# Patient Record
Sex: Female | Born: 1945 | Race: White | Hispanic: No | Smoking: Never smoker
Health system: Southern US, Community
[De-identification: ages and names within clinical notes are randomized; demographics above are authoritative.]

## PROBLEM LIST (undated history)

## (undated) DIAGNOSIS — R569 Unspecified convulsions: Secondary | ICD-10-CM

## (undated) DIAGNOSIS — F329 Major depressive disorder, single episode, unspecified: Secondary | ICD-10-CM

## (undated) DIAGNOSIS — F32A Depression, unspecified: Secondary | ICD-10-CM

## (undated) DIAGNOSIS — R6250 Unspecified lack of expected normal physiological development in childhood: Secondary | ICD-10-CM

## (undated) DIAGNOSIS — E079 Disorder of thyroid, unspecified: Secondary | ICD-10-CM

## (undated) HISTORY — DX: Disorder of thyroid, unspecified: E07.9

## (undated) HISTORY — DX: Depression, unspecified: F32.A

## (undated) HISTORY — PX: NECK SURGERY: SHX720

## (undated) HISTORY — PX: ABDOMINAL HYSTERECTOMY: SHX81

## (undated) HISTORY — DX: Major depressive disorder, single episode, unspecified: F32.9

---

## 1999-12-05 ENCOUNTER — Encounter: Payer: Self-pay | Admitting: Family Medicine

## 1999-12-05 ENCOUNTER — Encounter: Admission: RE | Admit: 1999-12-05 | Discharge: 1999-12-05 | Payer: Self-pay | Admitting: Family Medicine

## 2003-08-09 ENCOUNTER — Ambulatory Visit (HOSPITAL_COMMUNITY): Admission: RE | Admit: 2003-08-09 | Discharge: 2003-08-09 | Payer: Self-pay | Admitting: Family Medicine

## 2003-08-09 ENCOUNTER — Encounter: Payer: Self-pay | Admitting: Family Medicine

## 2003-09-14 ENCOUNTER — Other Ambulatory Visit: Admission: RE | Admit: 2003-09-14 | Discharge: 2003-09-14 | Payer: Self-pay | Admitting: Family Medicine

## 2003-11-23 ENCOUNTER — Encounter: Admission: RE | Admit: 2003-11-23 | Discharge: 2003-11-23 | Payer: Self-pay | Admitting: Family Medicine

## 2004-01-28 ENCOUNTER — Encounter: Admission: RE | Admit: 2004-01-28 | Discharge: 2004-01-28 | Payer: Self-pay | Admitting: Family Medicine

## 2004-03-20 ENCOUNTER — Emergency Department (HOSPITAL_COMMUNITY): Admission: EM | Admit: 2004-03-20 | Discharge: 2004-03-20 | Payer: Self-pay | Admitting: Emergency Medicine

## 2004-03-20 ENCOUNTER — Encounter (INDEPENDENT_AMBULATORY_CARE_PROVIDER_SITE_OTHER): Payer: Self-pay | Admitting: *Deleted

## 2004-03-20 ENCOUNTER — Ambulatory Visit (HOSPITAL_COMMUNITY): Admission: RE | Admit: 2004-03-20 | Discharge: 2004-03-20 | Payer: Self-pay | Admitting: Endocrinology

## 2004-05-05 ENCOUNTER — Encounter (INDEPENDENT_AMBULATORY_CARE_PROVIDER_SITE_OTHER): Payer: Self-pay | Admitting: Specialist

## 2004-05-05 ENCOUNTER — Ambulatory Visit (HOSPITAL_COMMUNITY): Admission: RE | Admit: 2004-05-05 | Discharge: 2004-05-06 | Payer: Self-pay | Admitting: Surgery

## 2004-08-18 ENCOUNTER — Ambulatory Visit (HOSPITAL_COMMUNITY): Admission: RE | Admit: 2004-08-18 | Discharge: 2004-08-18 | Payer: Self-pay | Admitting: Family Medicine

## 2005-02-13 IMAGING — US US BIOPSY
1 series · 11 of 11 positions shown · non-contrast
Comparison: none

CLINICAL DATA: Left thyroid and isthmus nodule.

[Series 1: unknown · 0.09mm/px · 11 of 11 slices shown]
[im 1/11]
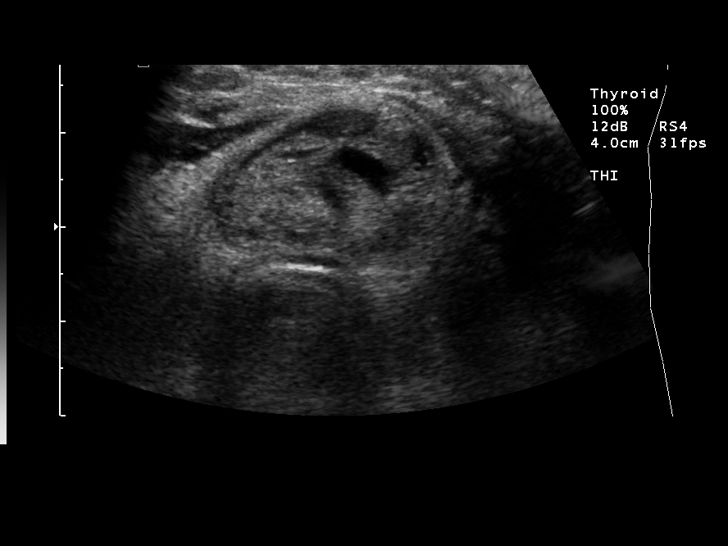
[im 2/11]
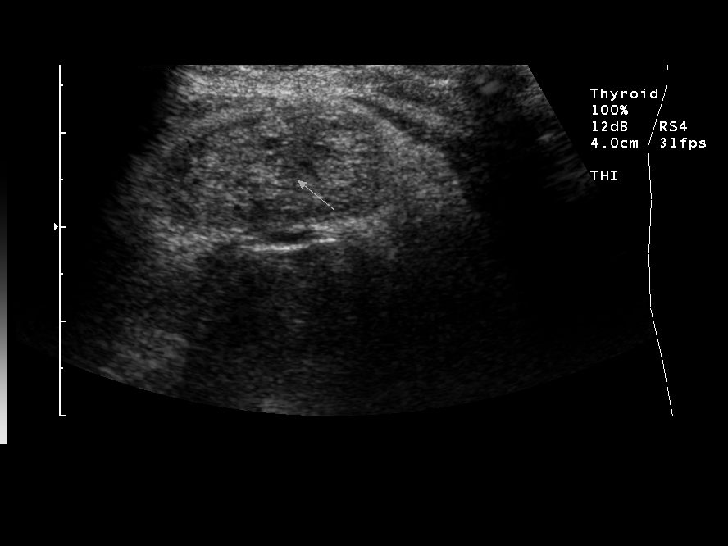
[im 3/11]
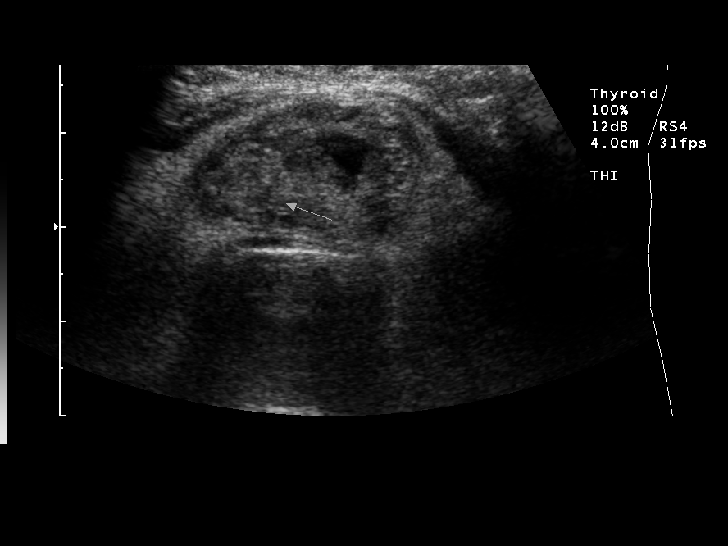
[im 4/11]
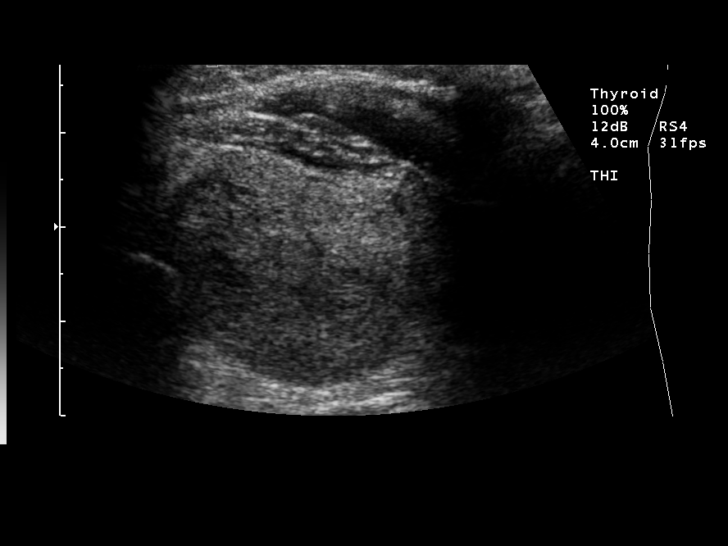
[im 5/11]
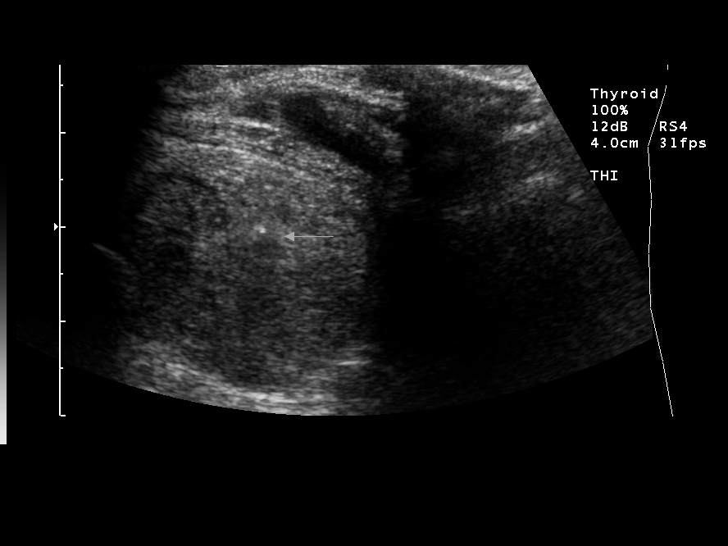
[im 6/11]
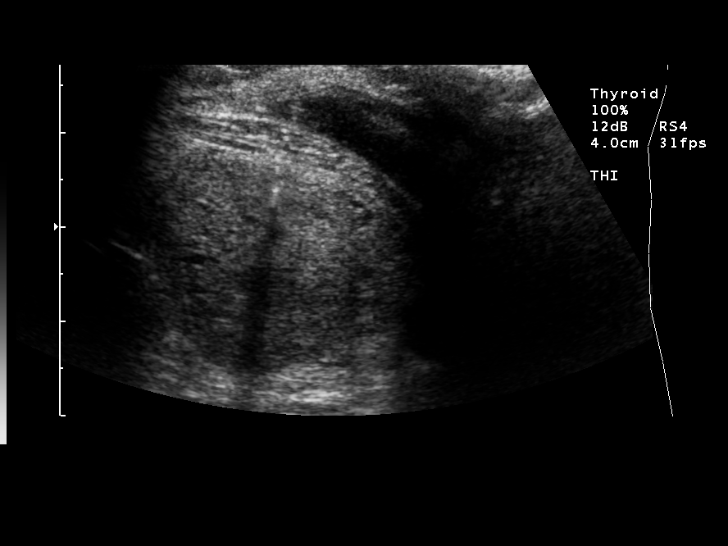
[im 7/11]
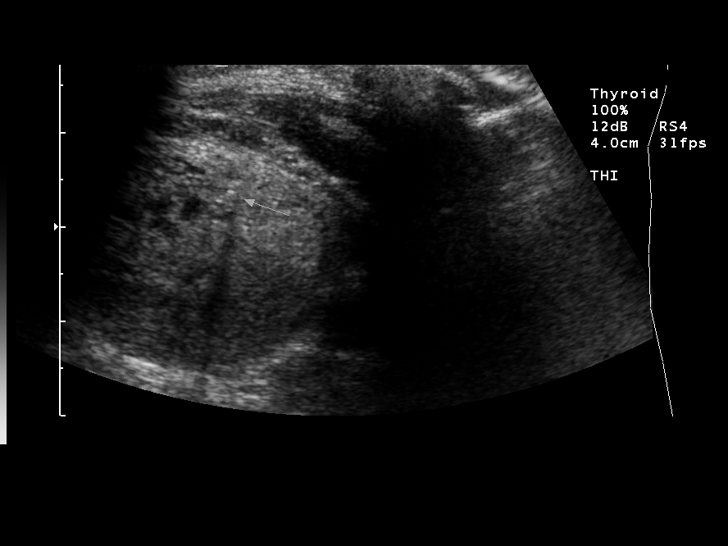
[im 8/11]
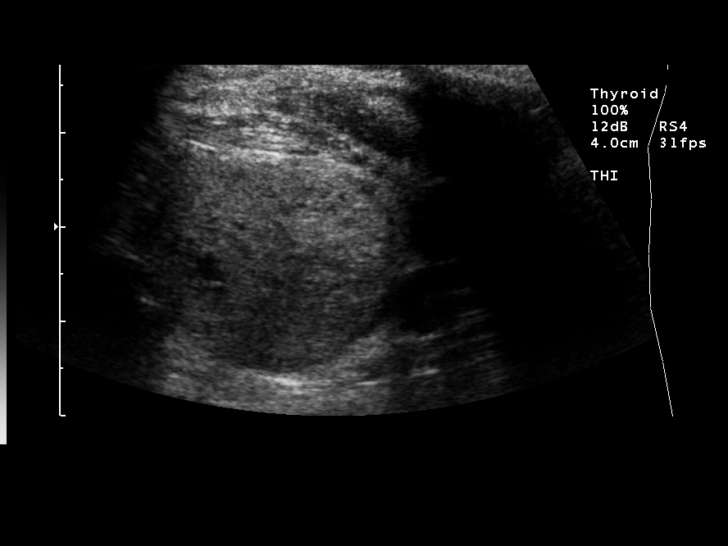
[im 9/11]
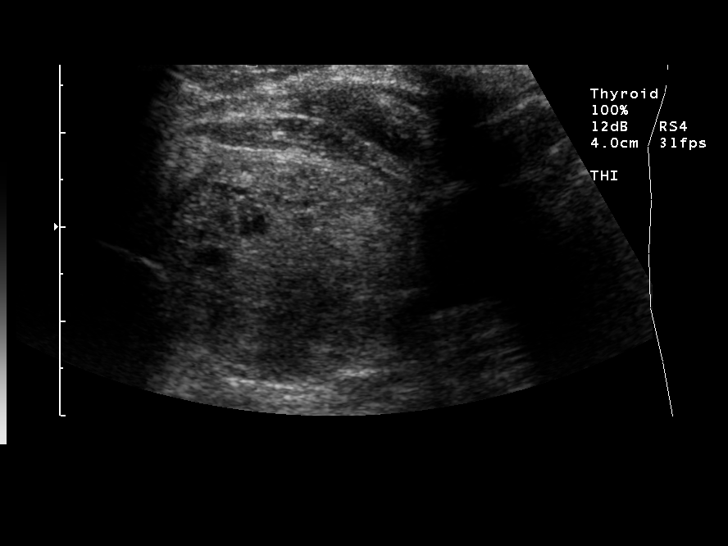
[im 10/11]
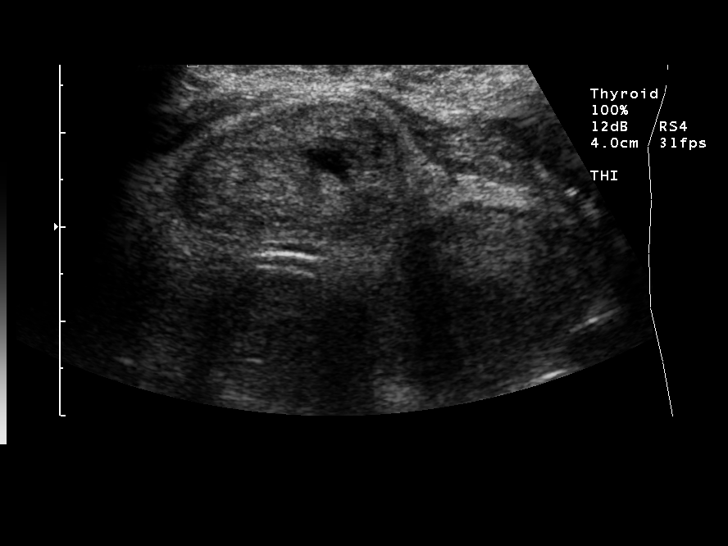
[im 11/11]
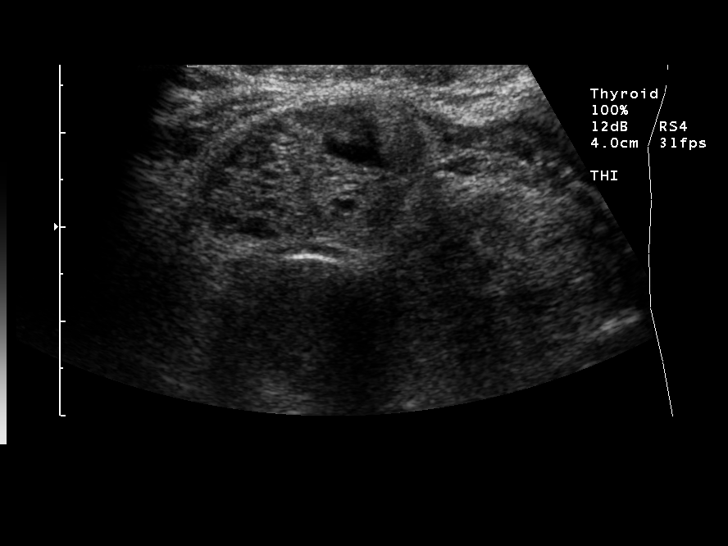

[11 of 11 positions shown; findings below may reference images not displayed]

ULTRASOUND GUIDED THYROID BIOPSY
An ultrasound guided thyroid biopsy was thoroughly discussed with the patient and questions were answered. Risks and benefits of the procedure were also delineated. Risks specifically discussed, including bleeding, bruising, infection, and risk of injury to adjacent blood vessels and nerves. The patient understands and wishes to proceed. Verbal and written consent was obtained. 

After the patient was prepped and draped in normal sterile fashion, 1% Lidocaine was used for local anesthesia. Using direct ultrasound guidance, three passes were made using a 25 gauge hypodermic needle into the nodule within the left lobe of the thyroid. An additional three passes were made using a 25 gauge hypodermic needle into the nodule within the isthmus of the thyroid.  These specimens were all given to cytology for further analysis. The patient tolerated the procedure well and there were no immediate complications. Doctor present was Dr. Htx. No hematoma was identified post procedure. 

IMPRESSION
Successful ultrasound guided fine needle aspiration of the left thyroid nodule and the isthmus nodule within the thyroid.

## 2005-03-07 ENCOUNTER — Emergency Department (HOSPITAL_COMMUNITY): Admission: EM | Admit: 2005-03-07 | Discharge: 2005-03-07 | Payer: Self-pay | Admitting: Emergency Medicine

## 2005-09-03 ENCOUNTER — Ambulatory Visit (HOSPITAL_COMMUNITY): Admission: RE | Admit: 2005-09-03 | Discharge: 2005-09-03 | Payer: Self-pay | Admitting: Family Medicine

## 2006-09-16 ENCOUNTER — Ambulatory Visit (HOSPITAL_COMMUNITY): Admission: RE | Admit: 2006-09-16 | Discharge: 2006-09-16 | Payer: Self-pay | Admitting: Family Medicine

## 2007-09-25 ENCOUNTER — Ambulatory Visit (HOSPITAL_COMMUNITY): Admission: RE | Admit: 2007-09-25 | Discharge: 2007-09-25 | Payer: Self-pay | Admitting: Family Medicine

## 2008-01-20 ENCOUNTER — Emergency Department (HOSPITAL_COMMUNITY): Admission: EM | Admit: 2008-01-20 | Discharge: 2008-01-20 | Payer: Self-pay | Admitting: Emergency Medicine

## 2008-01-21 ENCOUNTER — Ambulatory Visit: Payer: Self-pay | Admitting: Orthopedic Surgery

## 2008-01-21 DIAGNOSIS — S42213A Unspecified displaced fracture of surgical neck of unspecified humerus, initial encounter for closed fracture: Secondary | ICD-10-CM

## 2008-01-22 ENCOUNTER — Encounter: Payer: Self-pay | Admitting: Orthopedic Surgery

## 2008-02-05 ENCOUNTER — Ambulatory Visit: Payer: Self-pay | Admitting: Orthopedic Surgery

## 2008-02-06 ENCOUNTER — Telehealth: Payer: Self-pay | Admitting: Orthopedic Surgery

## 2008-02-17 ENCOUNTER — Encounter: Payer: Self-pay | Admitting: Orthopedic Surgery

## 2008-04-07 ENCOUNTER — Ambulatory Visit: Payer: Self-pay | Admitting: Orthopedic Surgery

## 2008-07-14 ENCOUNTER — Ambulatory Visit: Payer: Self-pay | Admitting: Orthopedic Surgery

## 2008-07-14 DIAGNOSIS — M25559 Pain in unspecified hip: Secondary | ICD-10-CM

## 2008-07-14 DIAGNOSIS — IMO0002 Reserved for concepts with insufficient information to code with codable children: Secondary | ICD-10-CM

## 2008-08-17 ENCOUNTER — Ambulatory Visit: Payer: Self-pay | Admitting: Orthopedic Surgery

## 2008-08-30 ENCOUNTER — Encounter: Payer: Self-pay | Admitting: Orthopedic Surgery

## 2008-08-31 ENCOUNTER — Telehealth: Payer: Self-pay | Admitting: Orthopedic Surgery

## 2008-09-07 ENCOUNTER — Ambulatory Visit: Payer: Self-pay | Admitting: Orthopedic Surgery

## 2008-09-07 ENCOUNTER — Telehealth: Payer: Self-pay | Admitting: Orthopedic Surgery

## 2008-09-10 ENCOUNTER — Telehealth: Payer: Self-pay | Admitting: Orthopedic Surgery

## 2008-09-16 ENCOUNTER — Encounter: Payer: Self-pay | Admitting: Orthopedic Surgery

## 2008-09-18 ENCOUNTER — Encounter: Payer: Self-pay | Admitting: Orthopedic Surgery

## 2008-09-23 ENCOUNTER — Encounter: Payer: Self-pay | Admitting: Orthopedic Surgery

## 2008-10-05 ENCOUNTER — Encounter: Payer: Self-pay | Admitting: Orthopedic Surgery

## 2008-10-07 ENCOUNTER — Ambulatory Visit: Payer: Self-pay | Admitting: Orthopedic Surgery

## 2008-10-15 ENCOUNTER — Ambulatory Visit (HOSPITAL_COMMUNITY): Admission: RE | Admit: 2008-10-15 | Discharge: 2008-10-15 | Payer: Self-pay | Admitting: Family Medicine

## 2008-11-22 ENCOUNTER — Ambulatory Visit: Payer: Self-pay | Admitting: Orthopedic Surgery

## 2009-10-31 ENCOUNTER — Ambulatory Visit (HOSPITAL_COMMUNITY): Admission: RE | Admit: 2009-10-31 | Discharge: 2009-10-31 | Payer: Self-pay | Admitting: Family Medicine

## 2010-11-20 ENCOUNTER — Ambulatory Visit (HOSPITAL_COMMUNITY)
Admission: RE | Admit: 2010-11-20 | Discharge: 2010-11-20 | Payer: Self-pay | Source: Home / Self Care | Admitting: Family Medicine

## 2011-05-04 NOTE — Op Note (Signed)
NAME:  Kari Padilla, Kari Padilla                          ACCOUNT NO.:  [id number]   MEDICAL RECORD NO.:  [id number]                   PATIENT TYPE:  OIB   LOCATION:  5712                                 FACILITY:  [facility]   PHYSICIAN:  Velora Heckler, M.D.                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   OPERATION PERFORMED:  Total thyroidectomy.   SURGEON:  Velora Heckler, M.D.   ASSISTANT:  Anselm Pancoast. Zachery Dakins, M.D.   ANESTHESIA:  General.   ESTIMATED BLOOD LOSS:  Minimal.   PREPARATION:  Betadine.   COMPLICATIONS:  None.   INDICATIONS FOR PROCEDURE:  The patient is a 65 year old white female seen  at the request of Dorisann Frames, M.D. for evaluation of multiple thyroid  nodules.  This had been found on routine physical exam.  Ultrasound had been  performed and fine needle aspiration of a dominant nodule in April 2005 at  [facility] showed follicular epithelium with a microfollicular  pattern and Hurthle cell change in the dominant left-sided nodule.  The  patient now comes to surgery for thyroidectomy.   DESCRIPTION OF PROCEDURE:  The procedure was done in operating room #17 at  the [facility].  The patient was brought to the  operating room and placed in supine position on the operating room table.  Following administration of general anesthesia, the patient was prepped and  draped in the usual strict aseptic fashion.  After ascertaining that an  adequate level of anesthesia had been obtained, a Kocher incision was made  with a #10 blade.  Dissection was carried down through subcutaneous tissues  and platysma.  Hemostasis was obtained with the  electrocautery.  Skin flaps  were developed cephalad and caudad.  A Mahorner self-retaining retractor was  placed for exposure.  Strap muscles were incised in the midline from the  thyroid notch to the sternal notch.  Dissection was carried onto the left  side of the gland first.  Strap muscles were reflected laterally.  The left  thyroid lobe was markedly enlarged.  There was a very large middle thyroid  vein which was divided between medium Ligaclips.  The entire gland appears  multinodular and goitrous.  Even the isthmus has a large central component  and a moderate-sized pyramidal lobe. The left thyroid lobe was gently  mobilized from surrounding tissues with a Barista.  Superior pole  was dissected out.  Pole vessels were ligated in continuity with 2-0 silk  ties and medium Ligaclips and divided.  The gland was rolled anteriorly.  Parathyroid tissue was identified both  superiorly and inferiorly and  preserved.  Branches of the inferior thyroid artery were divided between  small Ligaclips.  Inferior venous tributaries were ligated with 2-0 silk  ties and divided.  The gland was rolled onto the anterior trachea and the  ligament of Allyson Sabal was transected with the electrocautery.  The gland was  rolled across the midline.  Pyramidal lobe was dissected out with the  electrocautery.  The pack was placed in the left neck.  Next, we turned our  attention to the right thyroid lobe.  Again, strap muscles were reflected  laterally.  Again, a generous sized middle thyroid vein was identified and  divided between Ligaclips.  The parathyroid tissue was again identified  superiorly and inferiorly and preserved.  The superior pole vessels were  dissected out, ligated in continuity with 2-0 silk ties and medium Ligaclips  and divided.  Gland was rolled further medially.  Inferior venous  tributaries were ligated with 2-0 silk ties and divided.  Branches of the  inferior thyroid artery were  divided between small Ligaclips.  Recurrent  laryngeal nerve was identified and preserved.  Ligament of Allyson Sabal was  transected with the electrocautery and the gland was excised off the  anterior trachea.  The entire specimen was submitted to pathology for  review.  Good hemostasis was obtained on both sides of the neck.  The neck  was irrigated with warm saline which was evacuated.  Surgicel was placed  over the area of the recurrent laryngeal nerves bilaterally.  Strap muscles  were reapproximated in the midline with interrupted 3-0 Vicryl sutures.  Platysma was closed with interrupted 3-0 Vicryl sutures.  Skin edges were  reapproximated with a running 4-0 Vicryl subcuticular suture.  Wound was  washed and dried and benzoin and Steri-Strips applied.  Sterile gauze  dressings were applied.  The patient was awakened from anesthesia and  brought to the recovery room in stable condition.  The patient tolerated the  procedure well.                                               Velora Heckler, M.D.    TMG/MEDQ  D:  05/05/2004  T:  05/06/2004  Job:  [id number]   cc:   Joycelyn Rua, M.D.  [address] [place]  [place] [address]  Fax: [phone]   Dorisann Frames, M.D.  [address] [place] [address]  Fax: [phone]

## 2011-11-09 ENCOUNTER — Other Ambulatory Visit (HOSPITAL_COMMUNITY): Payer: Self-pay | Admitting: Family Medicine

## 2011-11-09 DIAGNOSIS — Z139 Encounter for screening, unspecified: Secondary | ICD-10-CM

## 2011-11-27 ENCOUNTER — Ambulatory Visit (HOSPITAL_COMMUNITY): Payer: Medicare Other

## 2012-01-05 ENCOUNTER — Encounter (HOSPITAL_COMMUNITY): Payer: Self-pay

## 2012-01-05 ENCOUNTER — Inpatient Hospital Stay (HOSPITAL_COMMUNITY)
Admission: EM | Admit: 2012-01-05 | Discharge: 2012-01-07 | DRG: 343 | Disposition: A | Discharge status: Home / Self Care | Payer: Medicare Other | Attending: General Surgery | Admitting: General Surgery

## 2012-01-05 ENCOUNTER — Emergency Department (HOSPITAL_COMMUNITY): Payer: Medicare Other

## 2012-01-05 DIAGNOSIS — K37 Unspecified appendicitis: Secondary | ICD-10-CM

## 2012-01-05 DIAGNOSIS — K3580 Unspecified acute appendicitis: Principal | ICD-10-CM | POA: Diagnosis present

## 2012-01-05 DIAGNOSIS — E039 Hypothyroidism, unspecified: Secondary | ICD-10-CM | POA: Diagnosis present

## 2012-01-05 HISTORY — DX: Unspecified convulsions: R56.9

## 2012-01-05 LAB — URINALYSIS, ROUTINE W REFLEX MICROSCOPIC
Hgb urine dipstick: NEGATIVE
Nitrite: NEGATIVE
Protein, ur: 30 mg/dL — AB
Urobilinogen, UA: 0.2 mg/dL (ref 0.0–1.0)

## 2012-01-05 LAB — CBC
MCH: 30.5 pg (ref 26.0–34.0)
MCHC: 32.8 g/dL (ref 30.0–36.0)
Platelets: 202 10*3/uL (ref 150–400)
RBC: 4.17 MIL/uL (ref 3.87–5.11)

## 2012-01-05 LAB — COMPREHENSIVE METABOLIC PANEL
AST: 20 U/L (ref 0–37)
BUN: 14 mg/dL (ref 6–23)
CO2: 29 mEq/L (ref 19–32)
Chloride: 96 mEq/L (ref 96–112)
Creatinine, Ser: 0.73 mg/dL (ref 0.50–1.10)
GFR calc Af Amer: >90 mL/min (ref >90)
GFR calc non Af Amer: 87 mL/min — ABNORMAL LOW (ref >90)
Glucose, Bld: 160 mg/dL — ABNORMAL HIGH (ref 70–99)
Total Bilirubin: 0.3 mg/dL (ref 0.3–1.2)

## 2012-01-05 LAB — DIFFERENTIAL
Basophils Absolute: 0 10*3/uL (ref 0.0–0.1)
Basophils Relative: 0 % (ref 0–1)
Eosinophils Absolute: 0 10*3/uL (ref 0.0–0.7)
Lymphocytes Relative: 6 % — ABNORMAL LOW (ref 12–46)
Monocytes Relative: 4 % (ref 3–12)
Neutro Abs: 16.5 10*3/uL — ABNORMAL HIGH (ref 1.7–7.7)
Neutrophils Relative %: 90 % — ABNORMAL HIGH (ref 43–77)

## 2012-01-05 LAB — PHENYTOIN LEVEL, TOTAL: Phenytoin Lvl: 6.7 ug/mL — ABNORMAL LOW (ref 10.0–20.0)

## 2012-01-05 LAB — URINE MICROSCOPIC-ADD ON

## 2012-01-05 LAB — LIPASE, BLOOD: Lipase: 22 U/L (ref 11–59)

## 2012-01-05 MED ORDER — IOHEXOL 300 MG/ML  SOLN
100.0000 mL | Freq: Once | INTRAMUSCULAR | Status: AC | PRN
  Administered 2012-01-05: 100 mL via INTRAVENOUS

## 2012-01-05 MED ORDER — IOHEXOL 300 MG/ML  SOLN
40.0000 mL | Freq: Once | INTRAMUSCULAR | Status: AC | PRN
  Administered 2012-01-05: 40 mL via INTRAVENOUS

## 2012-01-05 MED ORDER — SODIUM CHLORIDE 0.9 % IV SOLN
1.0000 g | Freq: Once | INTRAVENOUS | Status: AC
  Administered 2012-01-05: 1 g via INTRAVENOUS
  Filled 2012-01-05: qty 1

## 2012-01-05 MED ORDER — HYDROMORPHONE HCL PF 1 MG/ML IJ SOLN: 1.0000 mg | INTRAMUSCULAR | Status: DC | PRN

## 2012-01-05 MED ORDER — HYDROMORPHONE HCL PF 1 MG/ML IJ SOLN
1.0000 mg | Freq: Once | INTRAMUSCULAR | Status: AC
  Administered 2012-01-05: 1 mg via INTRAVENOUS
  Filled 2012-01-05: qty 1

## 2012-01-05 MED ORDER — LACTATED RINGERS IV SOLN
INTRAVENOUS | Status: DC
  Administered 2012-01-05 – 2012-01-06 (×2): via INTRAVENOUS

## 2012-01-05 MED ORDER — ONDANSETRON HCL 4 MG/2ML IJ SOLN
4.0000 mg | Freq: Once | INTRAMUSCULAR | Status: AC
  Administered 2012-01-05: 4 mg via INTRAVENOUS
  Filled 2012-01-05: qty 2

## 2012-01-05 MED ORDER — ACETAMINOPHEN 325 MG PO TABS
650.0000 mg | ORAL_TABLET | ORAL | Status: DC | PRN
Start: 2012-01-05 — End: 2012-01-06
  Administered 2012-01-06 (×2): 650 mg via ORAL
  Filled 2012-01-05: qty 2

## 2012-01-05 MED ORDER — SODIUM CHLORIDE 0.9 % IV SOLN
Freq: Once | INTRAVENOUS | Status: AC
  Administered 2012-01-05: 18:00:00 via INTRAVENOUS

## 2012-01-05 MED ORDER — ONDANSETRON HCL 4 MG/2ML IJ SOLN: 4.0000 mg | Freq: Four times a day (QID) | INTRAMUSCULAR | Status: DC | PRN

## 2012-01-05 MED ORDER — PANTOPRAZOLE SODIUM 40 MG IV SOLR
40.0000 mg | Freq: Every day | INTRAVENOUS | Status: DC
  Administered 2012-01-05 – 2012-01-06 (×2): 40 mg via INTRAVENOUS
  Filled 2012-01-05 (×2): qty 40

## 2012-01-05 NOTE — ED Notes (Signed)
Pt presents with lower right sided abdominal pain and vomiting x 3 days. Pt denies all other symptoms at this time.

## 2012-01-05 NOTE — ED Provider Notes (Signed)
History   This chart was scribed for [name], MD by Charolett Bumpers . The patient was seen in room APA12/APA12 and the patient's care was started at 5:47pm.   CSN: [id number]  Arrival date & time 01/05/12  1639   First MD Initiated Contact with Patient 01/05/12 1741      Chief Complaint  Patient presents with  . Abdominal Pain  . Emesis    (Consider location/radiation/quality/duration/timing/severity/associated sxs/prior treatment) HPI Kari Padilla is a 67 y.o. female who presents to the Emergency Department complaining of constant, moderate lower right-sided abdominal pain with an onset of 3 days ago. Patient also reports having associated vomiting (x1 today), nausea, and cough. Patient noted no blood in vomit, and denies fever, diarrhea, sore throat and dysuria. Patient report no other pain. Patient describes abdominal pain as dull and is getting worse. Nothing makes her symptoms better. She notes that her last meal was around noon, and has no fluids since. Patient notes that the only abdominal surgery she has had is a complete hysterectomy.    PCP: Dr Izola Price  Past Medical History  Diagnosis Date  . Seizures     Past Surgical History  Procedure Date  . Neck surgery   . Abdominal hysterectomy     No family history on file.  History  Substance Use Topics  . Smoking status: Never Smoker   . Smokeless tobacco: Current User    Types: Snuff  . Alcohol Use: No    OB History    Grav Para Term Preterm Abortions TAB SAB Ect Mult Living                  Review of Systems  Constitutional: Negative for fever, chills and fatigue.  HENT: Negative for congestion, sore throat, sinus pressure and ear discharge.   Eyes: Negative for discharge.  Respiratory: Positive for cough.   Cardiovascular: Negative for chest pain.  Gastrointestinal: Positive for nausea, vomiting and abdominal pain (Lower abdominal pain). Negative for diarrhea.  Genitourinary: Negative for  dysuria, frequency and hematuria.  Musculoskeletal: Negative for back pain.  Skin: Negative for rash.  Neurological: Negative for seizures, syncope and headaches.  Hematological: Negative.   Psychiatric/Behavioral: Negative for hallucinations.    Allergies  Codeine  Home Medications   Current Outpatient Rx  Name Route Sig Dispense Refill  . ASPIRIN EC 81 MG PO TBEC Oral Take 81 mg by mouth daily.    Marland Kitchen CALCIUM 1000 + D PO Oral Take 1,000 mg by mouth daily.    . DULOXETINE HCL 30 MG PO CPEP Oral Take 30 mg by mouth daily.    Marland Kitchen LEVOTHYROXINE SODIUM 150 MCG PO TABS Oral Take 150 mcg by mouth daily.    . OXYBUTYNIN CHLORIDE 5 MG PO TABS Oral Take 5 mg by mouth 2 (two) times daily.    Marland Kitchen PHENYTOIN SODIUM EXTENDED 100 MG PO CAPS Oral Take 100 mg by mouth 2 (two) times daily.    Marland Kitchen PRIMIDONE 250 MG PO TABS Oral Take 250 mg by mouth 2 (two) times daily.    . TOLTERODINE TARTRATE ER 2 MG PO CP24 Oral Take 2 mg by mouth daily.      BP 126/48  Pulse 92  Temp(Src) 99.9 F (37.7 C) (Oral)  Resp 20  Ht 5\' 4"  (1.626 m)  Wt 170 lb (77.111 kg)  BMI 29.18 kg/m2  SpO2 97%  Physical Exam  Constitutional: She is oriented to person, place, and time. She appears  well-developed.  HENT:  Head: Normocephalic and atraumatic.       Mucous membranes in mouth are dry.   Eyes: Conjunctivae and EOM are normal. No scleral icterus.  Neck: Neck supple. No thyromegaly present.  Cardiovascular: Normal rate and regular rhythm.  Exam reveals no gallop and no friction rub.   No murmur heard. Pulmonary/Chest: No stridor. She has no wheezes. She has no rales. She exhibits no tenderness.  Abdominal: She exhibits no distension. There is tenderness. There is no rebound.       Moderate RLQ tenderness.   Musculoskeletal: Normal range of motion. She exhibits no edema.  Lymphadenopathy:    She has no cervical adenopathy.  Neurological: She is oriented to person, place, and time. Coordination normal.  Skin: No rash  noted. No erythema.  Psychiatric: She has a normal mood and affect. Her behavior is normal.    ED Course  Procedures (including critical care time)   COORDINATION OF CARE:  1800: Medications Ordered: Ondasetron injection 4 mg-once; Hydromorphone injection 1 mg-once, 0.9% Sodium chloride infusion-once.  2040: Recheck: Patient informed of lab and imaging results. Dr. Girtha Rm was paged.   Results for orders placed during the hospital encounter of 01/05/12  CBC      Component Value Range   WBC 18.4 (*) 4.0 - 10.5 (K/uL)   RBC 4.17  3.87 - 5.11 (MIL/uL)   Hemoglobin 12.7  12.0 - 15.0 (g/dL)   HCT 16.1  09.6 - 04.5 (%)   MCV 92.8  78.0 - 100.0 (fL)   MCH 30.5  26.0 - 34.0 (pg)   MCHC 32.8  30.0 - 36.0 (g/dL)   RDW 40.9  81.1 - 91.4 (%)   Platelets 202  150 - 400 (K/uL)  DIFFERENTIAL      Component Value Range   Neutrophils Relative 90 (*) 43 - 77 (%)   Neutro Abs 16.5 (*) 1.7 - 7.7 (K/uL)   Lymphocytes Relative 6 (*) 12 - 46 (%)   Lymphs Abs 1.0  0.7 - 4.0 (K/uL)   Monocytes Relative 4  3 - 12 (%)   Monocytes Absolute 0.8  0.1 - 1.0 (K/uL)   Eosinophils Relative 0  0 - 5 (%)   Eosinophils Absolute 0.0  0.0 - 0.7 (K/uL)   Basophils Relative 0  0 - 1 (%)   Basophils Absolute 0.0  0.0 - 0.1 (K/uL)  COMPREHENSIVE METABOLIC PANEL      Component Value Range   Sodium 132 (*) 135 - 145 (mEq/L)   Potassium 4.3  3.5 - 5.1 (mEq/L)   Chloride 96  96 - 112 (mEq/L)   CO2 29  19 - 32 (mEq/L)   Glucose, Bld 160 (*) 70 - 99 (mg/dL)   BUN 14  6 - 23 (mg/dL)   Creatinine, Ser 7.82  0.50 - 1.10 (mg/dL)   Calcium 9.5  8.4 - 95.6 (mg/dL)   Total Protein 7.2  6.0 - 8.3 (g/dL)   Albumin 3.1 (*) 3.5 - 5.2 (g/dL)   AST 20  0 - 37 (U/L)   ALT 17  0 - 35 (U/L)   Alkaline Phosphatase 107  39 - 117 (U/L)   Total Bilirubin 0.3  0.3 - 1.2 (mg/dL)   GFR calc non Af Amer 87 (*) >90 (mL/min)   GFR calc Af Amer >90  >90 (mL/min)  LIPASE, BLOOD      Component Value Range   Lipase 22  11 - 59 (U/L)    URINALYSIS, ROUTINE W REFLEX  MICROSCOPIC      Component Value Range   Color, Urine YELLOW  YELLOW    APPearance CLEAR  CLEAR    Specific Gravity, Urine 1.030  1.005 - 1.030    pH 5.5  5.0 - 8.0    Glucose, UA 500 (*) NEGATIVE (mg/dL)   Hgb urine dipstick NEGATIVE  NEGATIVE    Bilirubin Urine NEGATIVE  NEGATIVE    Ketones, ur 15 (*) NEGATIVE (mg/dL)   Protein, ur 30 (*) NEGATIVE (mg/dL)   Urobilinogen, UA 0.2  0.0 - 1.0 (mg/dL)   Nitrite NEGATIVE  NEGATIVE    Leukocytes, UA NEGATIVE  NEGATIVE   URINE MICROSCOPIC-ADD ON      Component Value Range   Squamous Epithelial / LPF FEW (*) RARE    WBC, UA 11-20  <3 (WBC/hpf)   Bacteria, UA FEW (*) RARE    Urine-Other MUCOUS PRESENT     No results found.    1. Appendicitis       MDM    The chart was scribed for me under my direct supervision.  I personally performed the history, physical, and medical decision making and all procedures in the evaluation of this patient.[name], MD 01/05/12 2052

## 2012-01-06 ENCOUNTER — Other Ambulatory Visit: Payer: Self-pay

## 2012-01-06 ENCOUNTER — Encounter (HOSPITAL_COMMUNITY)
Admission: EM | Disposition: A | Discharge status: Home / Self Care | Payer: Self-pay | Source: Home / Self Care | Attending: General Surgery

## 2012-01-06 ENCOUNTER — Other Ambulatory Visit: Payer: Self-pay | Admitting: General Surgery

## 2012-01-06 ENCOUNTER — Inpatient Hospital Stay (HOSPITAL_COMMUNITY): Payer: Medicare Other | Admitting: Anesthesiology

## 2012-01-06 ENCOUNTER — Encounter (HOSPITAL_COMMUNITY): Payer: Self-pay | Admitting: Anesthesiology

## 2012-01-06 HISTORY — PX: LAPAROSCOPIC APPENDECTOMY: SHX408

## 2012-01-06 LAB — CBC
HCT: 33.9 % — ABNORMAL LOW (ref 36.0–46.0)
MCHC: 32.7 g/dL (ref 30.0–36.0)
Platelets: 170 10*3/uL (ref 150–400)
RDW: 13.8 % (ref 11.5–15.5)
WBC: 14.5 10*3/uL — ABNORMAL HIGH (ref 4.0–10.5)

## 2012-01-06 LAB — BASIC METABOLIC PANEL
Chloride: 99 mEq/L (ref 96–112)
GFR calc Af Amer: >90 mL/min (ref >90)
GFR calc non Af Amer: 88 mL/min — ABNORMAL LOW (ref >90)
Potassium: 4.2 mEq/L (ref 3.5–5.1)
Sodium: 134 mEq/L — ABNORMAL LOW (ref 135–145)

## 2012-01-06 LAB — URINE CULTURE
Colony Count: NO GROWTH
Culture: NO GROWTH

## 2012-01-06 LAB — SURGICAL PCR SCREEN
MRSA, PCR: NEGATIVE
Staphylococcus aureus: NEGATIVE

## 2012-01-06 SURGERY — APPENDECTOMY, LAPAROSCOPIC
Anesthesia: General | Wound class: Contaminated

## 2012-01-06 MED ORDER — OXYBUTYNIN CHLORIDE 5 MG PO TABS
5.0000 mg | ORAL_TABLET | Freq: Two times a day (BID) | ORAL | Status: DC
  Administered 2012-01-06 – 2012-01-07 (×3): 5 mg via ORAL
  Filled 2012-01-06 (×3): qty 1

## 2012-01-06 MED ORDER — LIDOCAINE HCL 1 % IJ SOLN
INTRAMUSCULAR | Status: DC | PRN
  Administered 2012-01-06: 25 mg via INTRADERMAL

## 2012-01-06 MED ORDER — SODIUM CHLORIDE 0.9 % IV SOLN: 1.0000 g | INTRAVENOUS | Status: DC

## 2012-01-06 MED ORDER — ACETAMINOPHEN 325 MG PO TABS
650.0000 mg | ORAL_TABLET | Freq: Four times a day (QID) | ORAL | Status: DC | PRN
  Administered 2012-01-06 – 2012-01-07 (×2): 650 mg via ORAL
  Filled 2012-01-06: qty 2

## 2012-01-06 MED ORDER — GLYCOPYRROLATE 0.2 MG/ML IJ SOLN
INTRAMUSCULAR | Status: AC
  Filled 2012-01-06: qty 1

## 2012-01-06 MED ORDER — ACETAMINOPHEN 325 MG PO TABS
ORAL_TABLET | ORAL | Status: AC
  Filled 2012-01-06: qty 2

## 2012-01-06 MED ORDER — SODIUM CHLORIDE 0.9 % IJ SOLN
INTRAMUSCULAR | Status: AC
  Administered 2012-01-06: 10 mL
  Filled 2012-01-06: qty 3

## 2012-01-06 MED ORDER — ROCURONIUM BROMIDE 50 MG/5ML IV SOLN
INTRAVENOUS | Status: AC
  Filled 2012-01-06: qty 1

## 2012-01-06 MED ORDER — DULOXETINE HCL 30 MG PO CPEP
30.0000 mg | ORAL_CAPSULE | Freq: Every day | ORAL | Status: DC
  Administered 2012-01-06 – 2012-01-07 (×2): 30 mg via ORAL
  Filled 2012-01-06 (×2): qty 1

## 2012-01-06 MED ORDER — NEOSTIGMINE METHYLSULFATE 1 MG/ML IJ SOLN
INTRAMUSCULAR | Status: DC | PRN
  Administered 2012-01-06: 3 mg via INTRAVENOUS

## 2012-01-06 MED ORDER — SODIUM CHLORIDE 0.9 % IR SOLN
Status: DC | PRN
  Administered 2012-01-06: 3000 mL

## 2012-01-06 MED ORDER — FENTANYL CITRATE 0.05 MG/ML IJ SOLN
INTRAMUSCULAR | Status: AC
  Filled 2012-01-06: qty 5

## 2012-01-06 MED ORDER — ONDANSETRON HCL 4 MG/2ML IJ SOLN
INTRAMUSCULAR | Status: DC | PRN
  Administered 2012-01-06: 4 mg via INTRAVENOUS

## 2012-01-06 MED ORDER — HYDROCODONE-ACETAMINOPHEN 5-325 MG PO TABS
1.0000 | ORAL_TABLET | ORAL | Status: DC | PRN
  Administered 2012-01-06: 2 via ORAL
  Filled 2012-01-06: qty 2

## 2012-01-06 MED ORDER — TOLTERODINE TARTRATE ER 2 MG PO CP24
2.0000 mg | ORAL_CAPSULE | Freq: Every day | ORAL | Status: DC
  Administered 2012-01-06: 2 mg via ORAL
  Filled 2012-01-06: qty 1

## 2012-01-06 MED ORDER — LIDOCAINE HCL (PF) 1 % IJ SOLN
INTRAMUSCULAR | Status: AC
  Filled 2012-01-06: qty 5

## 2012-01-06 MED ORDER — ACETAMINOPHEN 325 MG PO TABS
ORAL_TABLET | ORAL | Status: AC
  Administered 2012-01-06: 650 mg via ORAL
  Filled 2012-01-06: qty 2

## 2012-01-06 MED ORDER — BUPIVACAINE HCL (PF) 0.5 % IJ SOLN
INTRAMUSCULAR | Status: AC
  Filled 2012-01-06: qty 30

## 2012-01-06 MED ORDER — LACTATED RINGERS IV SOLN: INTRAVENOUS | Status: DC

## 2012-01-06 MED ORDER — MIDAZOLAM HCL 5 MG/5ML IJ SOLN
INTRAMUSCULAR | Status: DC | PRN
  Administered 2012-01-06: 2 mg via INTRAVENOUS

## 2012-01-06 MED ORDER — SODIUM CHLORIDE 0.9 % IV SOLN
1.0000 g | Freq: Once | INTRAVENOUS | Status: AC
  Administered 2012-01-06: 1 g via INTRAVENOUS
  Filled 2012-01-06: qty 1

## 2012-01-06 MED ORDER — ROCURONIUM BROMIDE 100 MG/10ML IV SOLN
INTRAVENOUS | Status: DC | PRN
  Administered 2012-01-06: 35 mg via INTRAVENOUS

## 2012-01-06 MED ORDER — FENTANYL CITRATE 0.05 MG/ML IJ SOLN
INTRAMUSCULAR | Status: DC | PRN
  Administered 2012-01-06: 50 ug via INTRAVENOUS
  Administered 2012-01-06 (×2): 100 ug via INTRAVENOUS

## 2012-01-06 MED ORDER — LEVOTHYROXINE SODIUM 150 MCG PO TABS
150.0000 ug | ORAL_TABLET | Freq: Every day | ORAL | Status: DC
  Administered 2012-01-06 – 2012-01-07 (×2): 150 ug via ORAL
  Filled 2012-01-06 (×2): qty 1

## 2012-01-06 MED ORDER — CELECOXIB 100 MG PO CAPS
200.0000 mg | ORAL_CAPSULE | Freq: Two times a day (BID) | ORAL | Status: DC
  Administered 2012-01-06 – 2012-01-07 (×3): 200 mg via ORAL
  Filled 2012-01-06 (×3): qty 2

## 2012-01-06 MED ORDER — MIDAZOLAM HCL 2 MG/2ML IJ SOLN
INTRAMUSCULAR | Status: AC
  Filled 2012-01-06: qty 2

## 2012-01-06 MED ORDER — GLYCOPYRROLATE 0.2 MG/ML IJ SOLN
INTRAMUSCULAR | Status: DC | PRN
  Administered 2012-01-06: .4 mg via INTRAVENOUS

## 2012-01-06 MED ORDER — PROPOFOL 10 MG/ML IV BOLUS
INTRAVENOUS | Status: DC | PRN
  Administered 2012-01-06: 130 mg via INTRAVENOUS

## 2012-01-06 MED ORDER — 0.9 % SODIUM CHLORIDE (POUR BTL) OPTIME
TOPICAL | Status: DC | PRN
  Administered 2012-01-06: 1000 mL

## 2012-01-06 MED ORDER — PRIMIDONE 250 MG PO TABS
250.0000 mg | ORAL_TABLET | Freq: Two times a day (BID) | ORAL | Status: DC
  Administered 2012-01-06 (×2): 250 mg via ORAL
  Filled 2012-01-06 (×3): qty 1

## 2012-01-06 MED ORDER — HYDROMORPHONE HCL PF 1 MG/ML IJ SOLN: 1.0000 mg | INTRAMUSCULAR | Status: DC | PRN

## 2012-01-06 MED ORDER — BUPIVACAINE HCL 0.5 % IJ SOLN
INTRAMUSCULAR | Status: DC | PRN
  Administered 2012-01-06: 10 mL

## 2012-01-06 MED ORDER — HYDROCODONE-ACETAMINOPHEN 5-325 MG PO TABS: 1.0000 | ORAL_TABLET | ORAL | Status: AC | PRN

## 2012-01-06 MED ORDER — PROPOFOL 10 MG/ML IV EMUL
INTRAVENOUS | Status: AC
  Filled 2012-01-06: qty 20

## 2012-01-06 MED ORDER — PHENYTOIN SODIUM EXTENDED 100 MG PO CAPS
100.0000 mg | ORAL_CAPSULE | Freq: Two times a day (BID) | ORAL | Status: DC
  Administered 2012-01-06 (×2): 100 mg via ORAL
  Filled 2012-01-06 (×2): qty 1

## 2012-01-06 SURGICAL SUPPLY — 49 items
APL SKNCLS STERI-STRIP NONHPOA (GAUZE/BANDAGES/DRESSINGS) ×1
BAG HAMPER (MISCELLANEOUS) ×2 IMPLANT
BAG SPEC RTRVL LRG 6X4 10 (ENDOMECHANICALS) ×1
BENZOIN TINCTURE PRP APPL 2/3 (GAUZE/BANDAGES/DRESSINGS) ×2 IMPLANT
CLOTH BEACON ORANGE TIMEOUT ST (SAFETY) ×2 IMPLANT
COVER LIGHT HANDLE STERIS (MISCELLANEOUS) ×4 IMPLANT
CUTTER ENDO LINEAR 45M (STAPLE) ×2 IMPLANT
DECANTER SPIKE VIAL GLASS SM (MISCELLANEOUS) ×2 IMPLANT
DEVICE TROCAR PUNCTURE CLOSURE (ENDOMECHANICALS) ×2 IMPLANT
DISSECTOR BLUNT TIP ENDO 5MM (MISCELLANEOUS) IMPLANT
DURAPREP 26ML APPLICATOR (WOUND CARE) ×2 IMPLANT
ELECT REM PT RETURN 9FT ADLT (ELECTROSURGICAL) ×2
ELECTRODE REM PT RTRN 9FT ADLT (ELECTROSURGICAL) ×1 IMPLANT
FILTER SMOKE EVAC LAPAROSHD (FILTER) ×2 IMPLANT
FORMALIN 10 PREFIL 120ML (MISCELLANEOUS) ×1 IMPLANT
FORMALIN 10 PREFIL 480ML (MISCELLANEOUS) ×2 IMPLANT
GLOVE BIOGEL PI IND STRL 7.0 (GLOVE) ×2 IMPLANT
GLOVE BIOGEL PI IND STRL 7.5 (GLOVE) ×1 IMPLANT
GLOVE BIOGEL PI INDICATOR 7.0 (GLOVE) ×2
GLOVE BIOGEL PI INDICATOR 7.5 (GLOVE) ×1
GLOVE ECLIPSE 7.0 STRL STRAW (GLOVE) ×2 IMPLANT
GLOVE SS BIOGEL STRL SZ 6.5 (GLOVE) IMPLANT
GLOVE SUPERSENSE BIOGEL SZ 6.5 (GLOVE) ×1
GOWN STRL REIN XL XLG (GOWN DISPOSABLE) ×5 IMPLANT
INST SET LAPROSCOPIC AP (KITS) ×2 IMPLANT
KIT ROOM TURNOVER APOR (KITS) ×2 IMPLANT
MANIFOLD NEPTUNE II (INSTRUMENTS) ×2 IMPLANT
NDL INSUFFLATION 14GA 120MM (NEEDLE) ×1 IMPLANT
NEEDLE INSUFFLATION 14GA 120MM (NEEDLE) ×2 IMPLANT
PACK LAP CHOLE LZT030E (CUSTOM PROCEDURE TRAY) ×2 IMPLANT
PAD ARMBOARD 7.5X6 YLW CONV (MISCELLANEOUS) ×2 IMPLANT
POUCH SPECIMEN RETRIEVAL 10MM (ENDOMECHANICALS) ×2 IMPLANT
RELOAD 45 VASCULAR/THIN (ENDOMECHANICALS) ×2 IMPLANT
RELOAD STAPLE 45 2.5 WHT GRN (ENDOMECHANICALS) IMPLANT
RELOAD STAPLE 45 3.5 BLU ETS (ENDOMECHANICALS) IMPLANT
RELOAD STAPLE TA45 3.5 REG BLU (ENDOMECHANICALS) IMPLANT
SEALER TISSUE G2 CVD JAW 35 (ENDOMECHANICALS) ×1 IMPLANT
SEALER TISSUE G2 CVD JAW 45CM (ENDOMECHANICALS)
SET BASIN LINEN APH (SET/KITS/TRAYS/PACK) ×2 IMPLANT
SET TUBE IRRIG SUCTION NO TIP (IRRIGATION / IRRIGATOR) ×1 IMPLANT
SLEEVE Z-THREAD 5X100MM (TROCAR) ×2 IMPLANT
STRIP CLOSURE SKIN 1/2X4 (GAUZE/BANDAGES/DRESSINGS) ×2 IMPLANT
SUT MNCRL AB 4-0 PS2 18 (SUTURE) ×2 IMPLANT
SUT VIC AB 2-0 CT2 27 (SUTURE) ×2 IMPLANT
TRAY FOLEY CATH 14FR (SET/KITS/TRAYS/PACK) ×2 IMPLANT
TROCAR Z-THRD FIOS HNDL 11X100 (TROCAR) ×2 IMPLANT
TROCAR Z-THREAD FIOS 5X100MM (TROCAR) ×2 IMPLANT
TROCAR Z-THREAD SLEEVE 11X100 (TROCAR) ×1 IMPLANT
WARMER LAPAROSCOPE (MISCELLANEOUS) ×2 IMPLANT

## 2012-01-06 NOTE — Transfer of Care (Signed)
Immediate Anesthesia Transfer of Care Note  Patient: Kari Padilla  Procedure(s) Performed:  APPENDECTOMY LAPAROSCOPIC  Patient Location: PACU  Anesthesia Type: General  Level of Consciousness: awake and patient cooperative  Airway & Oxygen Therapy: Patient Spontanous Breathing and Patient connected to face mask oxygen  Post-op Assessment: Report given to PACU RN, Post -op Vital signs reviewed and stable and Patient moving all extremities X 4  Post vital signs: Reviewed and stable  Complications: No apparent anesthesia complications

## 2012-01-06 NOTE — Plan of Care (Signed)
Problem: Diagnosis - Type of Surgery Goal: General Surgical Patient Education (See Patient Education module for education specifics)  Outcome: Completed/Met Date Met:  01/06/12 Also explained importance of cough, turn, deep breathe, and splinting after surgery to prevent pneumonia. Demonstrated to patient as well. Patient verbalized understanding

## 2012-01-06 NOTE — Progress Notes (Signed)
Awake. Rates pain 0. O2 d/c'd. O2 sat 88-91% on RA. O2 restarted with nasal cannula at 2l/m. O2 sat increases to 96%. Tolerated well.

## 2012-01-06 NOTE — Progress Notes (Signed)
Pt ambulated in hallway with stand-by assist; Pt tolerated very well; Pain is controlled; Will continue to monitor and anticipate discharge in am

## 2012-01-06 NOTE — Addendum Note (Signed)
Addendum  created 01/06/12 1029 by Cailyn Houdek J Zera Markwardt, CRNA   Modules edited:Anesthesia Medication Administration    

## 2012-01-06 NOTE — Progress Notes (Signed)
Patient continues to have oxygen sats drop, room air at 82%; Replaced on 2 liters and sats maintain 92-94%. Pt running low grade fever; MD notified of both; will continue to monitor patient and hold discharge till am.

## 2012-01-06 NOTE — Anesthesia Postprocedure Evaluation (Signed)
  Anesthesia Post-op Note  Patient: Kari Padilla  Procedure(s) Performed:  APPENDECTOMY LAPAROSCOPIC  Patient Location: PACU  Anesthesia Type: General  Level of Consciousness: awake, alert , oriented and patient cooperative  Airway and Oxygen Therapy: Patient Spontanous Breathing  Post-op Pain: 3 /10, mild  Post-op Assessment: Post-op Vital signs reviewed, Patient's Cardiovascular Status Stable, Respiratory Function Stable, Patent Airway and No signs of Nausea or vomiting  Post-op Vital Signs: Reviewed and stable  Complications: No apparent anesthesia complications

## 2012-01-06 NOTE — Addendum Note (Signed)
Addendum  created 01/06/12 1029 by Despina Hidden, CRNA   Modules edited:Anesthesia Medication Administration

## 2012-01-06 NOTE — H&P (Signed)
Kari Padilla is an 66 y.o. female.   Chief Complaint: Right lower quadrant abdominal pain HPI: Patient presented to [facility] emergency department with approximately 2-3 days of increasing right lower quadrant abdominal pain. Pain started initially in the periumbilical region and a localized to the right lower quadrant. She denies any similar symptomatology in the past. She has had associated nausea and several episodes of nonbloody emesis. No change in bowel movements. No melena no hematochezia. no change in urination. No unusual exposures or sick contacts.    Past Medical History  Diagnosis Date  . Seizures     Past Surgical History  Procedure Date  . Neck surgery   . Abdominal hysterectomy     History reviewed. No pertinent family history. Social History:  reports that she has never smoked. Her smokeless tobacco use includes Snuff. She reports that she does not drink alcohol or use illicit drugs.  Allergies:  Allergies  Allergen Reactions  . Codeine     Medications Prior to Admission  Medication Dose Route Frequency Provider Last Rate Last Dose  . 0.9 %  sodium chloride infusion   Intravenous Once [name], MD      . acetaminophen (TYLENOL) tablet 650 mg  650 mg Oral Q4H PRN Fabio Bering, MD   650 mg at 01/06/12 0644  . ertapenem (INVANZ) 1 g in sodium chloride 0.9 % 50 mL IVPB  1 g Intravenous Once [name], MD   1 g at 01/05/12 2101  . fentaNYL (SUBLIMAZE) 0.05 MG/ML injection           . HYDROmorphone (DILAUDID) injection 1 mg  1 mg Intravenous Once [name], MD   1 mg at 01/05/12 1813  . HYDROmorphone (DILAUDID) injection 1-2 mg  1-2 mg Intravenous Q4H PRN Fabio Bering, MD      . iohexol (OMNIPAQUE) 300 MG/ML solution 100 mL  100 mL Intravenous Once PRN Medication Radiologist, MD   100 mL at 01/05/12 2008  . iohexol (OMNIPAQUE) 300 MG/ML solution 40 mL  40 mL Intravenous Once PRN Medication Radiologist, MD   40 mL at 01/05/12 1817  . lactated  ringers infusion   Intravenous Continuous Fabio Bering, MD 125 mL/hr at 01/06/12 [id number]    . lidocaine (XYLOCAINE) 1 % injection           . midazolam (VERSED) 2 MG/2ML injection           . ondansetron (ZOFRAN) injection 4 mg  4 mg Intravenous Once [name], MD   4 mg at 01/05/12 1813  . ondansetron (ZOFRAN) injection 4 mg  4 mg Intravenous Q6H PRN Fabio Bering, MD      . pantoprazole (PROTONIX) injection 40 mg  40 mg Intravenous QHS Fabio Bering, MD   40 mg at 01/05/12 2202  . propofol (DIPRIVAN) 10 MG/ML infusion           . rocuronium (ZEMURON) 50 MG/5ML injection            No current outpatient prescriptions on file as of 01/06/2012.    Results for orders placed during the hospital encounter of 01/05/12 (from the past 48 hour(s))  CBC     Status: Abnormal   Collection Time   01/05/12  5:56 PM      Component Value Range Comment   WBC 18.4 (*) 4.0 - 10.5 (K/uL)    RBC 4.17  3.87 - 5.11 (MIL/uL)    Hemoglobin  12.7  12.0 - 15.0 (g/dL)    HCT 78.2  95.6 - 21.3 (%)    MCV 92.8  78.0 - 100.0 (fL)    MCH 30.5  26.0 - 34.0 (pg)    MCHC 32.8  30.0 - 36.0 (g/dL)    RDW 08.6  57.8 - 46.9 (%)    Platelets 202  150 - 400 (K/uL)   DIFFERENTIAL     Status: Abnormal   Collection Time   01/05/12  5:56 PM      Component Value Range Comment   Neutrophils Relative 90 (*) 43 - 77 (%)    Neutro Abs 16.5 (*) 1.7 - 7.7 (K/uL)    Lymphocytes Relative 6 (*) 12 - 46 (%)    Lymphs Abs 1.0  0.7 - 4.0 (K/uL)    Monocytes Relative 4  3 - 12 (%)    Monocytes Absolute 0.8  0.1 - 1.0 (K/uL)    Eosinophils Relative 0  0 - 5 (%)    Eosinophils Absolute 0.0  0.0 - 0.7 (K/uL)    Basophils Relative 0  0 - 1 (%)    Basophils Absolute 0.0  0.0 - 0.1 (K/uL)   COMPREHENSIVE METABOLIC PANEL     Status: Abnormal   Collection Time   01/05/12  5:56 PM      Component Value Range Comment   Sodium 132 (*) 135 - 145 (mEq/L)    Potassium 4.3  3.5 - 5.1 (mEq/L)    Chloride 96  96 - 112 (mEq/L)    CO2 29  19  - 32 (mEq/L)    Glucose, Bld 160 (*) 70 - 99 (mg/dL)    BUN 14  6 - 23 (mg/dL)    Creatinine, Ser 6.29  0.50 - 1.10 (mg/dL)    Calcium 9.5  8.4 - 10.5 (mg/dL)    Total Protein 7.2  6.0 - 8.3 (g/dL)    Albumin 3.1 (*) 3.5 - 5.2 (g/dL)    AST 20  0 - 37 (U/L)    ALT 17  0 - 35 (U/L)    Alkaline Phosphatase 107  39 - 117 (U/L)    Total Bilirubin 0.3  0.3 - 1.2 (mg/dL)    GFR calc non Af Amer 87 (*) >90 (mL/min)    GFR calc Af Amer >90  >90 (mL/min)   LIPASE, BLOOD     Status: Normal   Collection Time   01/05/12  5:56 PM      Component Value Range Comment   Lipase 22  11 - 59 (U/L)   PHENYTOIN LEVEL, TOTAL     Status: Abnormal   Collection Time   01/05/12  5:56 PM      Component Value Range Comment   Phenytoin Lvl 6.7 (*) 10.0 - 20.0 (ug/mL)   URINALYSIS, ROUTINE W REFLEX MICROSCOPIC     Status: Abnormal   Collection Time   01/05/12  6:12 PM      Component Value Range Comment   Color, Urine YELLOW  YELLOW     APPearance CLEAR  CLEAR     Specific Gravity, Urine 1.030  1.005 - 1.030     pH 5.5  5.0 - 8.0     Glucose, UA 500 (*) NEGATIVE (mg/dL)    Hgb urine dipstick NEGATIVE  NEGATIVE     Bilirubin Urine NEGATIVE  NEGATIVE     Ketones, ur 15 (*) NEGATIVE (mg/dL)    Protein, ur 30 (*) NEGATIVE (mg/dL)    Urobilinogen, UA  0.2  0.0 - 1.0 (mg/dL)    Nitrite NEGATIVE  NEGATIVE     Leukocytes, UA NEGATIVE  NEGATIVE    URINE MICROSCOPIC-ADD ON     Status: Abnormal   Collection Time   01/05/12  6:12 PM      Component Value Range Comment   Squamous Epithelial / LPF FEW (*) RARE     WBC, UA 11-20  <3 (WBC/hpf)    Bacteria, UA FEW (*) RARE     Urine-Other MUCOUS PRESENT     SURGICAL PCR SCREEN     Status: Normal   Collection Time   01/06/12  1:15 AM      Component Value Range Comment   MRSA, PCR NEGATIVE  NEGATIVE     Staphylococcus aureus NEGATIVE  NEGATIVE    CBC     Status: Abnormal   Collection Time   01/06/12  5:48 AM      Component Value Range Comment   WBC 14.5 (*) 4.0 - 10.5  (K/uL)    RBC 3.63 (*) 3.87 - 5.11 (MIL/uL)    Hemoglobin 11.1 (*) 12.0 - 15.0 (g/dL)    HCT 16.1 (*) 09.6 - 46.0 (%)    MCV 93.4  78.0 - 100.0 (fL)    MCH 30.6  26.0 - 34.0 (pg)    MCHC 32.7  30.0 - 36.0 (g/dL)    RDW 04.5  40.9 - 81.1 (%)    Platelets 170  150 - 400 (K/uL)   BASIC METABOLIC PANEL     Status: Abnormal   Collection Time   01/06/12  5:48 AM      Component Value Range Comment   Sodium 134 (*) 135 - 145 (mEq/L)    Potassium 4.2  3.5 - 5.1 (mEq/L)    Chloride 99  96 - 112 (mEq/L)    CO2 30  19 - 32 (mEq/L)    Glucose, Bld 111 (*) 70 - 99 (mg/dL)    BUN 10  6 - 23 (mg/dL)    Creatinine, Ser 9.14  0.50 - 1.10 (mg/dL)    Calcium 8.4  8.4 - 10.5 (mg/dL)    GFR calc non Af Amer 88 (*) >90 (mL/min)    GFR calc Af Amer >90  >90 (mL/min)    Ct Abdomen Pelvis W Contrast  01/05/2012  *RADIOLOGY REPORT*  Clinical Data: Right lower quadrant pain and emesis for 3 days.  CT ABDOMEN AND PELVIS WITH CONTRAST  Technique:  Multidetector CT imaging of the abdomen and pelvis was performed following the standard protocol during bolus administration of intravenous contrast.  Contrast: OMNIPAQUE IOHEXOL 300 MG/ML IV SOLN a  Comparison: None.  Findings: Dependent atelectasis at both lung bases.  The appendix is fluid filled, dilated, and its wall enhances.  It measures up to 10 mm in diameter on image #60 of series 2.  There is prominent periappendiceal fat stranding and fluid.  There is no evidence of abscess or free intraperitoneal air.  The liver, spleen, adrenal glands, pancreas, and kidneys are within normal limits.  The stomach and small bowel loops are normal.  The colon is normal in caliber.  No evidence of bowel obstruction. Urinary bladder is unremarkable.  Status post hysterectomy. Negative for lymphadenopathy.  Abdominal aorta is normal in caliber and is free of atherosclerotic change.  The inguinal regions are normal.  There is a moderate compression deformity of the L1 vertebral  body. No suspicious bony abnormality.  IMPRESSION:  1.  Acute appendicitis.  Findings were discussed with Dr. Estell Harpin and 8:40 p.m. 01/05/2012. 2.  Moderate compression deformity of L1.  Per CMS PQRS reporting requirements (PQRS Measure 24): Given the patient's age of greater than 50 and the fracture site (hip, distal radius, or spine), the patient should be tested for osteoporosis using DXA, and the appropriate treatment considered based on the DXA results.  Original Report Authenticated By: Britta Mccreedy, M.D.    Review of Systems  Constitutional: Positive for fever and chills. Negative for weight loss, malaise/fatigue and diaphoresis.  HENT: Negative.   Eyes: Negative.   Respiratory: Negative.   Cardiovascular: Negative.   Gastrointestinal: Positive for heartburn, nausea, vomiting and abdominal pain (right lower quadrant). Negative for diarrhea, constipation, blood in stool and melena.  Genitourinary: Negative.   Musculoskeletal: Negative.   Skin: Negative.   Neurological: Negative.  Negative for weakness.  Endo/Heme/Allergies: Negative.   Psychiatric/Behavioral: Negative.     Blood pressure 105/65, pulse 85, temperature 100.1 F (37.8 C), temperature source Oral, resp. rate 20, height 5\' 4"  (1.626 m), weight 74.844 kg (165 lb), SpO2 97.00%. Physical Exam  Constitutional: She is oriented to person, place, and time. She appears well-developed and well-nourished.  HENT:  Head: Normocephalic and atraumatic.  Eyes: Conjunctivae are normal. Pupils are equal, round, and reactive to light.  Neck: Normal range of motion. Neck supple. No tracheal deviation present. No thyromegaly present.  Cardiovascular: Normal rate, regular rhythm and normal heart sounds.   Respiratory: Effort normal and breath sounds normal. No respiratory distress. She has no wheezes.  GI: Soft. She exhibits no distension and no mass. There is tenderness (positive right lower quadrant abdominal tenderness. No diffuse  peritoneal signs. No Rovsing sign. Positive pain over McBurney's point.). There is guarding. There is no rebound.  Lymphadenopathy:    She has no cervical adenopathy.  Neurological: She is alert and oriented to person, place, and time.  Skin: Skin is warm and dry.     Assessment/Plan Acute appendicitis. At this point patient is continued in an n.p.o. status continue IV fluid hydration continued on Invanz. At this point she will be taken to the operating room for laparoscopic appendectomy possible open appendectomy. Risks benefits alternatives of a laparoscopic possible open appendectomy were discussed at length including but not limited to the risks of bleeding, infection, appendiceal stump leak, intraoperative cardiac and pulmonary events. At this point we'll proceed to the operating room as discussed with patient and family.  Rekia Kujala C 01/06/2012, 7:51 AM

## 2012-01-06 NOTE — Anesthesia Preprocedure Evaluation (Signed)
Anesthesia Evaluation   Patient awake    Reviewed: Allergy & Precautions, H&P , NPO status , Patient's Chart, lab work & pertinent test results, reviewed documented beta blocker date and time   Airway       Dental   Pulmonary neg pulmonary ROS,          Cardiovascular neg cardio ROS     Neuro/Psych    GI/Hepatic   Endo/Other  Hypothyroidism   Renal/GU      Musculoskeletal   Abdominal   Peds  Hematology   Anesthesia Other Findings   Reproductive/Obstetrics                           Anesthesia Physical Anesthesia Plan  ASA: II  Anesthesia Plan: General   Post-op Pain Management:    Induction: Intravenous  Airway Management Planned: Oral ETT  Additional Equipment:   Intra-op Plan:   Post-operative Plan: Extubation in OR  Informed Consent:   Plan Discussed with: Anesthesiologist and CRNA  Anesthesia Plan Comments:         Anesthesia Quick Evaluation

## 2012-01-06 NOTE — Progress Notes (Signed)
Removed O2 for ~10 mins while patient ambulated to BR. O2 sats 92% with patient sitting on the side of the bed. O2 sats rechecked at 2155 with sats maintaining in the 90's on RA. Will leave O2 off for now and continue to monitor patient and encourage deep breathing, coughing, and ambulating.

## 2012-01-06 NOTE — Plan of Care (Signed)
Problem: Diagnosis - Type of Surgery Goal: General Surgical Patient Education (See Patient Education module for education specifics) Outcome: Completed/Met Date Met:  01/06/12 Gave patient handouts related to appendicitis and her scheduled surgery, laparoscopic appendectomy. Explained how to keep from falling and to call before getting up. Patient verbalized understanding. No questions at this time

## 2012-01-06 NOTE — Op Note (Signed)
Patient:  Kari Padilla  DOB:  09-12-1946  MRN:  [id number]   Preop Diagnosis:  Acute appendicitis  Postop Diagnosis:  The same  Procedure:  Laparoscopic appendectomy  Surgeon:  Dr. Tilford Pillar  Anes:  General endotracheal, 0.5% Sensorcaine plain for local  Indications:  Patient is a 65 year old female presented to [facility] with right lower quadrant bowel pain. Workup and evaluation was consistent for acute appendicitis. Risks benefits alternatives a laparoscopic possible open appendectomy were discussed at length patient including but not limited to risk of bleeding, infection, appendiceal stump leak, intraoperative cardiac and pulmonary events. Patient's questions and concerns are addressed the patient was consented for the planned procedure.  Procedure note:  Patient is taken to the or is placed in a supine position on the or table which time the general anesthetic is a Optician, dispensing. At this point the nurse anesthetist and abates the patient successfully and a Foley catheter is inserted in standard sterile fashion by the operating room staff. Her abdomen is prepped with DuraPrep solution and draped in standard fashion. A stab incision was created supraumbilically with 11 blade scalpel with additional dissection down to subcuticular tissue carried out using a Coker clamp. The anterior wall fascia is grasped Coker clamp and lifted anteriorly. A Veress needle is inserted saline drop test is utilized confirm intraperitoneal placement. At this time pneumoperitoneum was initiated once sufficient pneumoperitoneum was obtained an 11 mm insert over laparoscopic allowing visualization the trocar entering into the peritoneal cavity. At this point the inner cannulas removed last dose reinserted there is no evidence of any varus or trocar placement injury. At this time I did notice a significant amount of pelvic adhesions likely secondarily to patient's previous abdominal hysterectomy. I was able to  safely insert the left lateral 11 mm trochars under direct visualization. At this trocar in place I did perform enter lysis on the extensive amount of abdominal wall adhesions. Allowing exposure of the pelvis and insertion of the 5 mm trocar in the suprapubic region. At this point patient was placed into a reverse Trendelenburg left decubitus position. The cecum was identified and followed oxylate to the terminal ileum and appendiceal base. There is a 60 inflammatory phlegmon in this area which was carefully dissected free from the appendix. There is tenderness material no along the appendix but no evidence of any perforation or rupture there is no pus or is contamination in this area. I did create a window at the base of the appendix with a [place] dissector between the appendix and the mesoappendix. The mesoappendix was divided using the LigaSure. The base of the appendix was divided using a Endo GIA 45 stapler load with a vascular staple load. At this point the appendix was free was placed into an Endo Catch bag was removed through the umbilical trocar site and intact Endo Catch bag. At this point the field was inspected. Copious amount of sterile irrigation was instilled. The returning aspirate was clear. Hemostasis was excellent. The staple line was noted to be intact. At this time and not identify any other concerning structures or findings at this time return my attention to closure.  Using an Endo Close suture passing device a 2-0 Vicryl sutures passed through both the 11 mm trocar sites. With the sutures in place the pneumoperitoneum was evacuated. Trochars were removed. The Vicryl sutures were secured. The local anesthetic was instilled. A 4-0 Monocryl was utilized reapproximate the skin edges at all 3 trocar sites. The skin was washed  dried moist dry towel. Benzoin is applied around incision half-inch Steri-Strips are placed. Additionally the patient was allowed to mild general second patient's range  the postanesthetic care unit stable condition. At the conclusion of procedure all instrument, sponge, needle counts are correct. Patient tolerated procedure extremely well.  Complications:  None apparent  EBL:  Less than 50 ML's  Specimen:  Appendix

## 2012-01-07 LAB — DIFFERENTIAL
Basophils Relative: 0 % (ref 0–1)
Lymphs Abs: 1.1 10*3/uL (ref 0.7–4.0)
Monocytes Relative: 7 % (ref 3–12)
Neutro Abs: 7.7 10*3/uL (ref 1.7–7.7)
Neutrophils Relative %: 80 % — ABNORMAL HIGH (ref 43–77)

## 2012-01-07 LAB — COMPREHENSIVE METABOLIC PANEL
ALT: 16 U/L (ref 0–35)
Albumin: 2.3 g/dL — ABNORMAL LOW (ref 3.5–5.2)
Alkaline Phosphatase: 98 U/L (ref 39–117)
BUN: 10 mg/dL (ref 6–23)
Chloride: 102 mEq/L (ref 96–112)
Potassium: 4.3 mEq/L (ref 3.5–5.1)
Total Bilirubin: 0.2 mg/dL — ABNORMAL LOW (ref 0.3–1.2)

## 2012-01-07 LAB — CBC
Hemoglobin: 10.2 g/dL — ABNORMAL LOW (ref 12.0–15.0)
MCHC: 32.6 g/dL (ref 30.0–36.0)
RBC: 3.34 MIL/uL — ABNORMAL LOW (ref 3.87–5.11)

## 2012-01-07 NOTE — Plan of Care (Signed)
Problem: Discharge Progression Outcomes Goal: Steri-Strips applied Outcome: Not Applicable Date Met:  01/07/12 Steri strip in place

## 2012-01-07 NOTE — Plan of Care (Signed)
Problem: Discharge Progression Outcomes Goal: Activity appropriate for discharge plan Outcome: Progressing Pt is unsteady on feet States she has been falling at home and say she discussed this with her physician

## 2012-01-07 NOTE — Addendum Note (Signed)
Addendum  created 01/07/12 1304 by Despina Hidden, CRNA   Modules edited:Notes Section

## 2012-01-07 NOTE — Discharge Summary (Signed)
Physician Discharge Summary  Patient ID: Kari Padilla MRN: [id number] DOB/AGE: 03-23-1946 66 y.o.  Admit date: 01/05/2012 Discharge date: 01/07/2012  Admission Diagnoses: Acute appendicitis  Discharge Diagnoses: The same Active Problems:  * No active hospital problems. *    Discharged Condition: stable  Hospital Course: Patient was admitted with right lower quadrant abdominal pain. She was taken to the operating room underwent a laparoscopic appendectomy. She is tolerated procedure extremely well. Postop bili she did have some oxygen desaturations likely secondarily to sedation and anesthesia. This has significantly improved over the last 24 hours. At this point she is tolerating a regular diet. Pain is controlled on oral pain medications. She has no fevers or chills. And no shortness of breath. Oxygenation is been normal. At this point I then made for discharge home.  Consults: none  Significant Diagnostic Studies: radiology: CT scan: Abdomen and pelvis  Treatments: surgery: Laparoscopic appendectomy  Discharge Exam: Blood pressure 114/69, pulse 79, temperature 98.4 F (36.9 C), temperature source Oral, resp. rate 20, height 5\' 4"  (1.626 m), weight 74.844 kg (165 lb), SpO2 92.00%. General appearance: alert and no distress Eyes: He does equal round reactive extraocular movements are intact. Resp: clear to auscultation bilaterally Cardio: regular rate and rhythm GI: Positive bowel sounds, soft, expected tenderness. Incisions are clean dry and intact. Steri-Strips are in place. No peritoneal signs. Extremities: Warm and dry, no edema  Disposition:   Discharge Orders    Future Orders Please Complete By Expires   Diet - low sodium heart healthy      Increase activity slowly      Discharge instructions      Comments:   Increase activity as tolerated. May place ice pack for comfort.    Driving Restrictions      Comments:   No driving while on pain medications.    Lifting  restrictions      Comments:   No lifting over 20lbs for 4-5 weeks post-op.    Discharge wound care:      Comments:   Clean surgical sites with soap and water.  May shower the morning after surgery unless instructed by Dr. Leticia Penna otherwise.  No soaking for 2-3 weeks.    If adhesive strips are in place, they may be removed in 1-2 weeks while in the shower.    Call MD for:  temperature >100.4      Call MD for:  persistant nausea and vomiting      Call MD for:  severe uncontrolled pain      Call MD for:  redness, tenderness, or signs of infection (pain, swelling, redness, odor or green/yellow discharge around incision site)        Medication List  As of 01/07/2012  9:00 AM   TAKE these medications         aspirin EC 81 MG tablet   Take 81 mg by mouth daily.      CALCIUM 1000 + D PO   Take 1,000 mg by mouth daily.      DULoxetine 30 MG capsule   Commonly known as: CYMBALTA   Take 30 mg by mouth daily.      HYDROcodone-acetaminophen 5-325 MG per tablet   Commonly known as: NORCO   Take 1-2 tablets by mouth every 4 (four) hours as needed for pain.      levothyroxine 150 MCG tablet   Commonly known as: SYNTHROID, LEVOTHROID   Take 150 mcg by mouth daily.      oxybutynin  5 MG tablet   Commonly known as: DITROPAN   Take 5 mg by mouth 2 (two) times daily.      phenytoin 100 MG ER capsule   Commonly known as: DILANTIN   Take 100 mg by mouth 2 (two) times daily.      primidone 250 MG tablet   Commonly known as: MYSOLINE   Take 250 mg by mouth 2 (two) times daily.      tolterodine 2 MG 24 hr capsule   Commonly known as: DETROL LA   Take 2 mg by mouth daily.           Follow-up Information    Follow up with Cynda Familia, MD .         Signed: Alisha Bacus C 01/07/2012, 9:00 AM

## 2012-01-07 NOTE — Plan of Care (Signed)
Problem: Discharge Progression Outcomes Goal: Other Discharge Outcomes/Goals Outcome: Completed/Met Date Met:  01/07/12 Pt is without pain tolerating po intake Steri strips to three incisions on abd are intact clean dry  Discharge instructions read to pt and her family by Rosalene Billings RN  Pt discharged to home with family

## 2012-01-07 NOTE — Anesthesia Postprocedure Evaluation (Signed)
  Anesthesia Post-op Note  Patient: Kari Padilla  Procedure(s) Performed:  APPENDECTOMY LAPAROSCOPIC  Patient Location: room 315  Anesthesia Type: General  Level of Consciousness: awake, alert , oriented and patient cooperative  Airway and Oxygen Therapy: Patient Spontanous Breathing  Post-op Pain: none  Post-op Assessment: Post-op Vital signs reviewed, Patient's Cardiovascular Status Stable, Respiratory Function Stable, RESPIRATORY FUNCTION UNSTABLE, No signs of Nausea or vomiting, Adequate PO intake and Pain level controlled  Post-op Vital Signs: Reviewed and stable  Complications: No apparent anesthesia complications

## 2012-01-08 ENCOUNTER — Encounter (HOSPITAL_COMMUNITY): Payer: Self-pay | Admitting: General Surgery

## 2013-11-20 ENCOUNTER — Other Ambulatory Visit (HOSPITAL_COMMUNITY): Payer: Self-pay | Admitting: Family Medicine

## 2013-11-20 DIAGNOSIS — Z139 Encounter for screening, unspecified: Secondary | ICD-10-CM

## 2013-11-26 ENCOUNTER — Ambulatory Visit (HOSPITAL_COMMUNITY)
Admission: RE | Admit: 2013-11-26 | Discharge: 2013-11-26 | Disposition: A | Discharge status: Home / Self Care | Payer: Medicare Other | Source: Ambulatory Visit | Attending: Family Medicine | Admitting: Family Medicine

## 2013-11-26 DIAGNOSIS — Z139 Encounter for screening, unspecified: Secondary | ICD-10-CM

## 2013-11-26 DIAGNOSIS — Z1231 Encounter for screening mammogram for malignant neoplasm of breast: Secondary | ICD-10-CM | POA: Insufficient documentation

## 2013-12-14 ENCOUNTER — Ambulatory Visit (INDEPENDENT_AMBULATORY_CARE_PROVIDER_SITE_OTHER): Payer: Medicare Other | Admitting: Family Medicine

## 2013-12-14 ENCOUNTER — Encounter (INDEPENDENT_AMBULATORY_CARE_PROVIDER_SITE_OTHER): Payer: Self-pay

## 2013-12-14 ENCOUNTER — Encounter: Payer: Self-pay | Admitting: Family Medicine

## 2013-12-14 VITALS — BP 138/65 | HR 69 | Temp 97.3°F | Ht 64.0 in | Wt 170.0 lb

## 2013-12-14 DIAGNOSIS — R569 Unspecified convulsions: Secondary | ICD-10-CM

## 2013-12-14 DIAGNOSIS — E039 Hypothyroidism, unspecified: Secondary | ICD-10-CM

## 2013-12-14 DIAGNOSIS — Z23 Encounter for immunization: Secondary | ICD-10-CM

## 2013-12-14 DIAGNOSIS — F329 Major depressive disorder, single episode, unspecified: Secondary | ICD-10-CM

## 2013-12-14 DIAGNOSIS — F32A Depression, unspecified: Secondary | ICD-10-CM

## 2013-12-14 MED ORDER — DULOXETINE HCL 30 MG PO CPEP: 30.0000 mg | ORAL_CAPSULE | Freq: Every day | ORAL | Status: DC

## 2013-12-14 MED ORDER — LEVOTHYROXINE SODIUM 150 MCG PO TABS: 150.0000 ug | ORAL_TABLET | Freq: Every day | ORAL | Status: DC

## 2013-12-14 MED ORDER — ALPRAZOLAM 1 MG PO TABS: 1.0000 mg | ORAL_TABLET | Freq: Every evening | ORAL | Status: DC | PRN

## 2013-12-14 MED ORDER — PRIMIDONE 250 MG PO TABS: 250.0000 mg | ORAL_TABLET | Freq: Two times a day (BID) | ORAL | Status: DC

## 2013-12-14 MED ORDER — PHENYTOIN SODIUM EXTENDED 100 MG PO CAPS: 100.0000 mg | ORAL_CAPSULE | Freq: Two times a day (BID) | ORAL | Status: DC

## 2013-12-14 NOTE — Addendum Note (Signed)
Addended by: Bernita Buffy on: 08/17/2013 03:16 PM   Modules accepted: Orders

## 2013-12-14 NOTE — Patient Instructions (Signed)
Seizure, Adult A seizure is abnormal electrical activity in the brain. Seizures can cause a change in attention or behavior (altered mental status). Seizures often involve uncontrollable shaking (convulsions). Seizures usually last from 30 seconds to 2 minutes. Epilepsy is a brain disorder in which a patient has repeated seizures over time. CAUSES  There are many different problems that can cause seizures. In some cases, no cause is found. Common causes of seizures include:  Head injuries.  Brain tumors.  Infections.  Imbalance of chemicals in the blood.  Kidney failure or liver failure.  Heart disease.  Drug abuse.  Stroke.  Withdrawal from certain drugs or alcohol.  Birth defects.  Malfunction of a neurosurgical device placed in the brain. SYMPTOMS  Symptoms vary depending on the part of the brain that is involved. Right before a seizure, you may have a warning (aura) that a seizure is about to occur. An aura may include the following symptoms:   Fear or anxiety.  Nausea.  Feeling like the room is spinning (vertigo).  Vision changes, such as seeing flashing lights or spots. Common symptoms during a seizure include:  Convulsions.  Drooling.  Rapid eye movements.  Grunting.  Loss of bladder and bowel control.  Bitter taste in the mouth. After a seizure, you may feel confused and sleepy. You may also have an injury resulting from convulsions during the seizure. DIAGNOSIS  Your caregiver will perform a physical exam and run some tests to determine the type and cause of your seizure. These tests may include:  Blood tests.  A lumbar puncture test. In this test, a small amount of fluid is removed from the spine and examined.  Electrocardiography (ECG). This test records the electrical activity in your heart.  Imaging tests, such as computed tomography (CT) scans or magnetic resonance imaging (MRI).  Electroencephalography (EEG). This test records the electrical  activity in your brain. TREATMENT  Seizures usually stop on their own. Treatment will depend on the cause of your seizure. In some cases, medicine may be given to prevent future seizures. HOME CARE INSTRUCTIONS   If you are given medicines, take them exactly as prescribed by your caregiver.  Keep all follow-up appointments as directed by your caregiver.  Do not swim or drive until your caregiver says it is okay.  Teach friends and family what to do if you have a seizure. They should:  Lay you on the ground to prevent a fall.  Put a cushion under your head.  Loosen any tight clothing around your neck.  Turn you on your side. If vomiting occurs, this helps keep your airway clear.  Stay with you until you recover. SEEK IMMEDIATE MEDICAL CARE IF:  The seizure lasts longer than 2 to 5 minutes.  The seizure is severe or the person does not wake up after the seizure.  The person has altered mental status. Drive the person to the emergency department or call your local emergency services (911 in [place] MAKE SURE YOU:  Understand these instructions.  Will watch your condition.  Will get help right away if you are not doing well or get worse. Document Released: 11/30/2000 Document Revised: 02/25/2012 Document Reviewed: 11/21/2011 Exit[employer] Patient Information 2014 [employer].  

## 2013-12-14 NOTE — Progress Notes (Signed)
   Subjective:    Patient ID: Kari Padilla, female    DOB: [birth year], 67 y.o.   MRN: [id number]  HPI  This 67 y.o. female presents for evaluation of establishment and needing refills. She has hx of depression and has been on xanax for years.  Her PCP no longer Takes medicare and she has to change to another PCP.  She has been on dilantin And mysoline for sz disorder.  She has hx of hypothyroidism.  Review of Systems    No chest pain, SOB, HA, dizziness, vision change, N/V, diarrhea, constipation, dysuria, urinary urgency or frequency, myalgias, arthralgias or rash.  Objective:   Physical Exam  Vital signs noted  Well developed well nourished female.  HEENT - Head atraumatic Normocephalic                Eyes - PERRLA, Conjuctiva - clear Sclera- Clear EOMI                Ears - EAC's Wnl TM's Wnl Gross Hearing WNL                Nose - Nares patent                 Throat - oropharanx wnl Respiratory - Lungs CTA bilateral Cardiac - RRR S1 and S2 without murmur GI - Abdomen soft Nontender and bowel sounds active x 4 Extremities - No edema.  Neuro - Grossly intact.      Assessment & Plan:  Unspecified hypothyroidism - Plan: levothyroxine (SYNTHROID, LEVOTHROID) 150 MCG tablet  Depression - Plan: ALPRAZolam (XANAX) 1 MG tablet, DULoxetine (CYMBALTA) 30 MG capsule  Seizures - Plan: primidone (MYSOLINE) 250 MG tablet, phenytoin (DILANTIN) 100 MG ER capsule  Follow up in 3 months Deatra Canter FNP

## 2014-01-07 ENCOUNTER — Encounter: Payer: Self-pay | Admitting: Family Medicine

## 2014-01-07 ENCOUNTER — Ambulatory Visit (INDEPENDENT_AMBULATORY_CARE_PROVIDER_SITE_OTHER): Payer: Medicare Other | Admitting: Family Medicine

## 2014-01-07 VITALS — BP 124/62 | HR 70 | Temp 98.9°F | Ht 64.0 in | Wt 170.0 lb

## 2014-01-07 DIAGNOSIS — E039 Hypothyroidism, unspecified: Secondary | ICD-10-CM

## 2014-01-07 DIAGNOSIS — F329 Major depressive disorder, single episode, unspecified: Secondary | ICD-10-CM

## 2014-01-07 DIAGNOSIS — R569 Unspecified convulsions: Secondary | ICD-10-CM

## 2014-01-07 DIAGNOSIS — E079 Disorder of thyroid, unspecified: Secondary | ICD-10-CM | POA: Insufficient documentation

## 2014-01-07 DIAGNOSIS — F32A Depression, unspecified: Secondary | ICD-10-CM

## 2014-01-07 DIAGNOSIS — F3289 Other specified depressive episodes: Secondary | ICD-10-CM

## 2014-01-07 LAB — POCT CBC
Granulocyte percent: 68.6 %G (ref 37–80)
HCT, POC: 39.6 % (ref 37.7–47.9)
Hemoglobin: 13.3 g/dL (ref 12.2–16.2)
Lymph, poc: 1.9 (ref 0.6–3.4)
MCH, POC: 30.8 pg (ref 27–31.2)
MCHC: 33.5 g/dL (ref 31.8–35.4)
MCV: 92 fL (ref 80–97)
MPV: 9.7 fL (ref 0–99.8)
POC Granulocyte: 4.8 (ref 2–6.9)
POC LYMPH PERCENT: 27.1 %L (ref 10–50)
Platelet Count, POC: 213 10*3/uL (ref 142–424)
RBC: 4.3 M/uL (ref 4.04–5.48)
RDW, POC: 13.5 %
WBC: 7 10*3/uL (ref 4.6–10.2)

## 2014-01-07 MED ORDER — DULOXETINE HCL 30 MG PO CPEP: 30.0000 mg | ORAL_CAPSULE | Freq: Two times a day (BID) | ORAL | Status: DC

## 2014-01-07 MED ORDER — PHENYTOIN SODIUM EXTENDED 100 MG PO CAPS: 100.0000 mg | ORAL_CAPSULE | Freq: Two times a day (BID) | ORAL | Status: DC

## 2014-01-07 MED ORDER — LEVOTHYROXINE SODIUM 150 MCG PO TABS: 150.0000 ug | ORAL_TABLET | Freq: Every day | ORAL | Status: DC

## 2014-01-07 MED ORDER — PRIMIDONE 250 MG PO TABS: 250.0000 mg | ORAL_TABLET | Freq: Two times a day (BID) | ORAL | Status: DC

## 2014-01-07 NOTE — Patient Instructions (Signed)

## 2014-01-07 NOTE — Progress Notes (Signed)
   Subjective:    Patient ID: Kari Padilla, female    DOB: [birth year], 68 y.o.   MRN: [id number]  HPI This 68 y.o. female presents for evaluation of follow up on her medicine. She is accompanied By her daughter who states she cannot take the xanax because she falls.  She does better on  cymbalta 62m po bid.  She has hx of hypothyroidism and sz disorder.   Review of Systems C/o somnolence No chest pain, SOB, HA, dizziness, vision change, N/V, diarrhea, constipation, dysuria, urinary urgency or frequency, myalgias, arthralgias or rash.     Objective:   Physical Exam Vital signs noted  Well developed well nourished female.  HEENT - Head atraumatic Normocephalic                Eyes - PERRLA, Conjuctiva - clear Sclera- Clear EOMI                Ears - EAC's Wnl TM's Wnl Gross Hearing WNL                Nose - Nares patent                 Throat - oropharanx wnl Respiratory - Lungs CTA bilateral Cardiac - RRR S1 and S2 without murmur GI - Abdomen soft Nontender and bowel sounds active x 4 Extremities - No edema. Neuro - Grossly intact.       Assessment & Plan:  Depression - Plan: DULoxetine (CYMBALTA) 30 MG capsule, POCT CBC, TSH, CMP14+EGFR  Unspecified hypothyroidism - Plan: TSH, levothyroxine (SYNTHROID, LEVOTHROID) 150 MCG tablet  Seizures - Plan: POCT CBC, TSH, CMP14+EGFR, phenytoin (DILANTIN) 100 MG ER capsule, primidone (MYSOLINE) 250 MG tablet  WLysbeth PennerFNP

## 2014-01-08 LAB — CMP14+EGFR
ALT: 18 IU/L (ref 0–32)
AST: 26 IU/L (ref 0–40)
Albumin/Globulin Ratio: 1.7 (ref 1.1–2.5)
Albumin: 4 g/dL (ref 3.6–4.8)
Alkaline Phosphatase: 115 IU/L (ref 39–117)
BUN/Creatinine Ratio: 19 (ref 11–26)
BUN: 14 mg/dL (ref 8–27)
CO2: 27 mmol/L (ref 18–29)
Calcium: 9.2 mg/dL (ref 8.7–10.3)
Chloride: 101 mmol/L (ref 97–108)
Creatinine, Ser: 0.75 mg/dL (ref 0.57–1.00)
GFR calc Af Amer: 95 mL/min/{1.73_m2} (ref >59)
GFR calc non Af Amer: 82 mL/min/{1.73_m2} (ref >59)
Globulin, Total: 2.3 g/dL (ref 1.5–4.5)
Glucose: 72 mg/dL (ref 65–99)
Potassium: 4.7 mmol/L (ref 3.5–5.2)
Sodium: 141 mmol/L (ref 134–144)
Total Bilirubin: <0.2 mg/dL (ref 0.0–1.2)
Total Protein: 6.3 g/dL (ref 6.0–8.5)

## 2014-01-08 LAB — TSH: TSH: 1.92 u[IU]/mL (ref 0.450–4.500)

## 2014-03-15 ENCOUNTER — Ambulatory Visit (INDEPENDENT_AMBULATORY_CARE_PROVIDER_SITE_OTHER): Payer: Medicare Other | Admitting: Family Medicine

## 2014-03-15 ENCOUNTER — Encounter: Payer: Self-pay | Admitting: Family Medicine

## 2014-03-15 VITALS — BP 141/66 | HR 68 | Temp 97.2°F | Ht 64.0 in | Wt 169.2 lb

## 2014-03-15 DIAGNOSIS — F3289 Other specified depressive episodes: Secondary | ICD-10-CM

## 2014-03-15 DIAGNOSIS — F32A Depression, unspecified: Secondary | ICD-10-CM

## 2014-03-15 DIAGNOSIS — J069 Acute upper respiratory infection, unspecified: Secondary | ICD-10-CM

## 2014-03-15 DIAGNOSIS — E039 Hypothyroidism, unspecified: Secondary | ICD-10-CM

## 2014-03-15 DIAGNOSIS — F329 Major depressive disorder, single episode, unspecified: Secondary | ICD-10-CM

## 2014-03-15 DIAGNOSIS — R569 Unspecified convulsions: Secondary | ICD-10-CM

## 2014-03-15 MED ORDER — PRIMIDONE 250 MG PO TABS: 250.0000 mg | ORAL_TABLET | Freq: Two times a day (BID) | ORAL | Status: DC

## 2014-03-15 MED ORDER — PHENYTOIN SODIUM EXTENDED 100 MG PO CAPS: 100.0000 mg | ORAL_CAPSULE | Freq: Two times a day (BID) | ORAL | Status: DC

## 2014-03-15 MED ORDER — LEVOTHYROXINE SODIUM 150 MCG PO TABS: 150.0000 ug | ORAL_TABLET | Freq: Every day | ORAL | Status: DC

## 2014-03-15 MED ORDER — AMOXICILLIN 875 MG PO TABS: 875.0000 mg | ORAL_TABLET | Freq: Two times a day (BID) | ORAL | Status: DC

## 2014-03-15 MED ORDER — DULOXETINE HCL 30 MG PO CPEP: 30.0000 mg | ORAL_CAPSULE | Freq: Two times a day (BID) | ORAL | Status: DC

## 2014-03-15 NOTE — Progress Notes (Signed)
   Subjective:    Patient ID: Jay SchlichterBetty L Bolte, female    DOB: 1946/07/21, 68 y.o.   MRN: [id number]  HPI This 68 y.o. female presents for evaluation of routine follow up.  She has hx of sz disorder and she Has not had sz activity in years.  She has hx of hypothyroidism and depression.   Review of Systems No chest pain, SOB, HA, dizziness, vision change, N/V, diarrhea, constipation, dysuria, urinary urgency or frequency, myalgias, arthralgias or rash.     Objective:   Physical Exam  Vital signs noted  Well developed well nourished female.  HEENT - Head atraumatic Normocephalic                Eyes - PERRLA, Conjuctiva - clear Sclera- Clear EOMI                Ears - EAC's Wnl TM's Wnl Gross Hearing WNL                Nose - Nares patent                 Throat - oropharanx wnl Respiratory - Lungs CTA bilateral Cardiac - RRR S1 and S2 without murmur GI - Abdomen soft Nontender and bowel sounds active x 4 Extremities - No edema. Neuro - Grossly intact.      Assessment & Plan:  Seizures - Plan: primidone (MYSOLINE) 250 MG tablet, phenytoin (DILANTIN) 100 MG ER capsule  Unspecified hypothyroidism - Plan: levothyroxine (SYNTHROID, LEVOTHROID) 150 MCG tablet  Depression - Plan: DULoxetine (CYMBALTA) 30 MG capsule  URI (upper respiratory infection) - Plan: amoxicillin (AMOXIL) 875 MG tablet  Deatra CanterWilliam J Junell Cullifer FNP

## 2014-07-26 ENCOUNTER — Ambulatory Visit (INDEPENDENT_AMBULATORY_CARE_PROVIDER_SITE_OTHER): Payer: Medicare Other | Admitting: Family Medicine

## 2014-07-26 ENCOUNTER — Encounter: Payer: Self-pay | Admitting: Family Medicine

## 2014-07-26 VITALS — BP 113/63 | HR 66 | Temp 97.8°F | Ht 64.0 in | Wt 167.0 lb

## 2014-07-26 DIAGNOSIS — F32A Depression, unspecified: Secondary | ICD-10-CM

## 2014-07-26 DIAGNOSIS — F3289 Other specified depressive episodes: Secondary | ICD-10-CM

## 2014-07-26 DIAGNOSIS — R569 Unspecified convulsions: Secondary | ICD-10-CM

## 2014-07-26 DIAGNOSIS — F329 Major depressive disorder, single episode, unspecified: Secondary | ICD-10-CM

## 2014-07-26 DIAGNOSIS — E039 Hypothyroidism, unspecified: Secondary | ICD-10-CM

## 2014-07-26 LAB — POCT CBC
Granulocyte percent: 63.1 %G (ref 37–80)
HCT, POC: 42.7 % (ref 37.7–47.9)
Hemoglobin: 13.8 g/dL (ref 12.2–16.2)
Lymph, poc: 1.9 (ref 0.6–3.4)
MCH, POC: 29.6 pg (ref 27–31.2)
MCHC: 32.3 g/dL (ref 31.8–35.4)
MCV: 91.6 fL (ref 80–97)
MPV: 9.8 fL (ref 0–99.8)
POC Granulocyte: 4.1 (ref 2–6.9)
POC LYMPH PERCENT: 29.8 %L (ref 10–50)
Platelet Count, POC: 212 10*3/uL (ref 142–424)
RBC: 4.7 M/uL (ref 4.04–5.48)
RDW, POC: 13.7 %
WBC: 6.5 10*3/uL (ref 4.6–10.2)

## 2014-07-26 MED ORDER — PHENYTOIN SODIUM EXTENDED 100 MG PO CAPS: 100.0000 mg | ORAL_CAPSULE | Freq: Two times a day (BID) | ORAL | Status: AC

## 2014-07-26 MED ORDER — LEVOTHYROXINE SODIUM 150 MCG PO TABS: 150.0000 ug | ORAL_TABLET | Freq: Every day | ORAL | Status: AC

## 2014-07-26 MED ORDER — PRIMIDONE 250 MG PO TABS: 250.0000 mg | ORAL_TABLET | Freq: Two times a day (BID) | ORAL | Status: AC

## 2014-07-26 MED ORDER — DULOXETINE HCL 30 MG PO CPEP: 30.0000 mg | ORAL_CAPSULE | Freq: Two times a day (BID) | ORAL | Status: AC

## 2014-07-26 NOTE — Progress Notes (Signed)
   Subjective:    Patient ID: Kari Padilla, female    DOB: 11/02/1946, 68 y.o.   MRN: [id number]  HPI This 68 y.o. female presents for evaluation of routine follow up.  She has hx of sz disorder and has not had any sz activity according to patient in several years.  She has hx of hypothyroidism, OA, DDD of the LS spine, an sz.  She is taking phenytoin an mysoiline for sz prophylaxis.  She has no acute complaints today.   Review of Systems No chest pain, SOB, HA, dizziness, vision change, N/V, diarrhea, constipation, dysuria, urinary urgency or frequency, myalgias, arthralgias or rash.     Objective:   Physical Exam Vital signs noted  Well developed well nourished female.  HEENT - Head atraumatic Normocephalic                Eyes - PERRLA, Conjuctiva - clear Sclera- Clear EOMI                Ears - EAC's Wnl TM's Wnl Gross Hearing WNL                Nose - Nares patent                 Throat - oropharanx wnl Respiratory - Lungs CTA bilateral Cardiac - RRR S1 and S2 without murmur GI - Abdomen soft Nontender and bowel sounds active x 4 Extremities - No edema. Neuro - Grossly intact.       Assessment & Plan:  Depression - Plan: DULoxetine (CYMBALTA) 30 MG capsule  Unspecified hypothyroidism - Plan: levothyroxine (SYNTHROID, LEVOTHROID) 150 MCG tablet  Seizures - Plan: phenytoin (DILANTIN) 100 MG ER capsule, primidone (MYSOLINE) 250 MG tablet, POCT CBC, CMP14+EGFR, Phenytoin level, total and free  Follow up in 6 months  Lysbeth Penner FNP

## 2014-07-27 LAB — CMP14+EGFR
ALT: 20 IU/L (ref 0–32)
AST: 27 IU/L (ref 0–40)
Albumin/Globulin Ratio: 1.7 (ref 1.1–2.5)
Albumin: 4.2 g/dL (ref 3.6–4.8)
Alkaline Phosphatase: 94 IU/L (ref 39–117)
BUN/Creatinine Ratio: 16 (ref 11–26)
BUN: 12 mg/dL (ref 8–27)
CO2: 27 mmol/L (ref 18–29)
Calcium: 9.8 mg/dL (ref 8.7–10.3)
Chloride: 99 mmol/L (ref 97–108)
Creatinine, Ser: 0.77 mg/dL (ref 0.57–1.00)
GFR calc Af Amer: 91 mL/min/{1.73_m2} (ref >59)
GFR calc non Af Amer: 79 mL/min/{1.73_m2} (ref >59)
Globulin, Total: 2.5 g/dL (ref 1.5–4.5)
Glucose: 79 mg/dL (ref 65–99)
Potassium: 5.6 mmol/L — ABNORMAL HIGH (ref 3.5–5.2)
Sodium: 139 mmol/L (ref 134–144)
Total Bilirubin: <0.2 mg/dL (ref 0.0–1.2)
Total Protein: 6.7 g/dL (ref 6.0–8.5)

## 2014-07-27 LAB — PHENYTOIN LEVEL, FREE
Phenytoin, Free: 1.1 ug/mL (ref 1.0–2.0)
Phenytoin: 12 ug/mL (ref 10.0–20.0)

## 2015-02-01 ENCOUNTER — Ambulatory Visit: Payer: Medicare Other | Admitting: Family Medicine

## 2015-02-10 ENCOUNTER — Ambulatory Visit: Payer: Medicare Other | Admitting: Family Medicine

## 2015-09-06 ENCOUNTER — Telehealth: Payer: Self-pay | Admitting: Family Medicine

## 2015-10-07 ENCOUNTER — Telehealth: Payer: Self-pay | Admitting: Family Medicine

## 2015-12-05 ENCOUNTER — Telehealth: Payer: Self-pay | Admitting: Family Medicine

## 2016-01-05 ENCOUNTER — Telehealth: Payer: Self-pay | Admitting: Family Medicine

## 2016-06-04 ENCOUNTER — Telehealth: Payer: Self-pay | Admitting: Family Medicine

## 2016-07-04 ENCOUNTER — Telehealth: Payer: Self-pay | Admitting: Family Medicine

## 2018-12-17 DEATH — deceased

## 2019-12-11 ENCOUNTER — Other Ambulatory Visit: Payer: Self-pay

## 2019-12-11 ENCOUNTER — Emergency Department (HOSPITAL_COMMUNITY): Payer: Medicare Other

## 2019-12-11 ENCOUNTER — Inpatient Hospital Stay (HOSPITAL_COMMUNITY)
Admission: EM | Admit: 2019-12-11 | Discharge: 2019-12-18 | DRG: 177 | Disposition: E | Payer: Medicare Other | Attending: Internal Medicine | Admitting: Internal Medicine

## 2019-12-11 DIAGNOSIS — Z885 Allergy status to narcotic agent status: Secondary | ICD-10-CM | POA: Diagnosis not present

## 2019-12-11 DIAGNOSIS — R6250 Unspecified lack of expected normal physiological development in childhood: Secondary | ICD-10-CM | POA: Diagnosis present

## 2019-12-11 DIAGNOSIS — G40909 Epilepsy, unspecified, not intractable, without status epilepticus: Secondary | ICD-10-CM | POA: Diagnosis present

## 2019-12-11 DIAGNOSIS — J9601 Acute respiratory failure with hypoxia: Secondary | ICD-10-CM | POA: Diagnosis present

## 2019-12-11 DIAGNOSIS — J1289 Other viral pneumonia: Secondary | ICD-10-CM | POA: Diagnosis present

## 2019-12-11 DIAGNOSIS — F32A Depression, unspecified: Secondary | ICD-10-CM | POA: Diagnosis present

## 2019-12-11 DIAGNOSIS — R569 Unspecified convulsions: Secondary | ICD-10-CM

## 2019-12-11 DIAGNOSIS — F329 Major depressive disorder, single episode, unspecified: Secondary | ICD-10-CM | POA: Diagnosis present

## 2019-12-11 DIAGNOSIS — J1282 Pneumonia due to coronavirus disease 2019: Secondary | ICD-10-CM | POA: Diagnosis present

## 2019-12-11 DIAGNOSIS — Z720 Tobacco use: Secondary | ICD-10-CM

## 2019-12-11 DIAGNOSIS — R4182 Altered mental status, unspecified: Secondary | ICD-10-CM | POA: Diagnosis present

## 2019-12-11 DIAGNOSIS — U071 COVID-19: Secondary | ICD-10-CM | POA: Diagnosis present

## 2019-12-11 DIAGNOSIS — R4189 Other symptoms and signs involving cognitive functions and awareness: Secondary | ICD-10-CM | POA: Diagnosis present

## 2019-12-11 DIAGNOSIS — E039 Hypothyroidism, unspecified: Secondary | ICD-10-CM | POA: Diagnosis present

## 2019-12-11 DIAGNOSIS — J80 Acute respiratory distress syndrome: Secondary | ICD-10-CM | POA: Diagnosis present

## 2019-12-11 DIAGNOSIS — Z7982 Long term (current) use of aspirin: Secondary | ICD-10-CM | POA: Diagnosis not present

## 2019-12-11 DIAGNOSIS — Z79899 Other long term (current) drug therapy: Secondary | ICD-10-CM

## 2019-12-11 DIAGNOSIS — Z9981 Dependence on supplemental oxygen: Secondary | ICD-10-CM | POA: Diagnosis not present

## 2019-12-11 DIAGNOSIS — Z79890 Hormone replacement therapy (postmenopausal): Secondary | ICD-10-CM

## 2019-12-11 DIAGNOSIS — Z66 Do not resuscitate: Secondary | ICD-10-CM | POA: Diagnosis present

## 2019-12-11 DIAGNOSIS — J189 Pneumonia, unspecified organism: Secondary | ICD-10-CM

## 2019-12-11 HISTORY — DX: Unspecified lack of expected normal physiological development in childhood: R62.50

## 2019-12-11 LAB — CBC WITH DIFFERENTIAL/PLATELET
Abs Immature Granulocytes: 0.05 10*3/uL (ref 0.00–0.07)
Basophils Absolute: 0 10*3/uL (ref 0.0–0.1)
Basophils Relative: 0 %
Eosinophils Absolute: 0 10*3/uL (ref 0.0–0.5)
Eosinophils Relative: 0 %
HCT: 36.8 % (ref 36.0–46.0)
Hemoglobin: 11.7 g/dL — ABNORMAL LOW (ref 12.0–15.0)
Immature Granulocytes: 1 %
Lymphocytes Relative: 7 %
Lymphs Abs: 0.6 10*3/uL — ABNORMAL LOW (ref 0.7–4.0)
MCH: 30.2 pg (ref 26.0–34.0)
MCHC: 31.8 g/dL (ref 30.0–36.0)
MCV: 95.1 fL (ref 80.0–100.0)
Monocytes Absolute: 0.3 10*3/uL (ref 0.1–1.0)
Monocytes Relative: 4 %
Neutro Abs: 7.3 10*3/uL (ref 1.7–7.7)
Neutrophils Relative %: 88 %
Platelets: 236 10*3/uL (ref 150–400)
RBC: 3.87 MIL/uL (ref 3.87–5.11)
RDW: 14 % (ref 11.5–15.5)
WBC: 8.2 10*3/uL (ref 4.0–10.5)
nRBC: 0 % (ref 0.0–0.2)

## 2019-12-11 LAB — BLOOD GAS, VENOUS
Acid-Base Excess: 4.4 mmol/L — ABNORMAL HIGH (ref 0.0–2.0)
Bicarbonate: 27.4 mmol/L (ref 20.0–28.0)
FIO2: 98
O2 Saturation: 67.1 %
Patient temperature: 39.4
pCO2, Ven: 44.8 mmHg (ref 44.0–60.0)
pH, Ven: 7.422 (ref 7.250–7.430)
pO2, Ven: 35.1 mmHg (ref 32.0–45.0)

## 2019-12-11 LAB — URINALYSIS, ROUTINE W REFLEX MICROSCOPIC
Bacteria, UA: NONE SEEN
Bilirubin Urine: NEGATIVE
Glucose, UA: NEGATIVE mg/dL
Ketones, ur: NEGATIVE mg/dL
Leukocytes,Ua: NEGATIVE
Nitrite: NEGATIVE
Protein, ur: 100 mg/dL — AB
Specific Gravity, Urine: 1.032 — ABNORMAL HIGH (ref 1.005–1.030)
pH: 5 (ref 5.0–8.0)

## 2019-12-11 LAB — PHENYTOIN LEVEL, TOTAL: Phenytoin Lvl: 12.1 ug/mL (ref 10.0–20.0)

## 2019-12-11 LAB — COMPREHENSIVE METABOLIC PANEL
ALT: 26 U/L (ref 0–44)
AST: 37 U/L (ref 15–41)
Albumin: 2.8 g/dL — ABNORMAL LOW (ref 3.5–5.0)
Alkaline Phosphatase: 64 U/L (ref 38–126)
Anion gap: 9 (ref 5–15)
BUN: 15 mg/dL (ref 8–23)
CO2: 23 mmol/L (ref 22–32)
Calcium: 8 mg/dL — ABNORMAL LOW (ref 8.9–10.3)
Chloride: 99 mmol/L (ref 98–111)
Creatinine, Ser: 0.75 mg/dL (ref 0.44–1.00)
GFR calc Af Amer: >60 mL/min (ref >60)
GFR calc non Af Amer: >60 mL/min (ref >60)
Glucose, Bld: 156 mg/dL — ABNORMAL HIGH (ref 70–99)
Potassium: 4.2 mmol/L (ref 3.5–5.1)
Sodium: 131 mmol/L — ABNORMAL LOW (ref 135–145)
Total Bilirubin: 0.4 mg/dL (ref 0.3–1.2)
Total Protein: 6.6 g/dL (ref 6.5–8.1)

## 2019-12-11 LAB — CBC
HCT: 35 % — ABNORMAL LOW (ref 36.0–46.0)
Hemoglobin: 11.2 g/dL — ABNORMAL LOW (ref 12.0–15.0)
MCH: 30.4 pg (ref 26.0–34.0)
MCHC: 32 g/dL (ref 30.0–36.0)
MCV: 94.9 fL (ref 80.0–100.0)
Platelets: 222 10*3/uL (ref 150–400)
RBC: 3.69 MIL/uL — ABNORMAL LOW (ref 3.87–5.11)
RDW: 14 % (ref 11.5–15.5)
WBC: 8.8 10*3/uL (ref 4.0–10.5)
nRBC: 0 % (ref 0.0–0.2)

## 2019-12-11 LAB — CREATININE, SERUM
Creatinine, Ser: 0.7 mg/dL (ref 0.44–1.00)
GFR calc Af Amer: >60 mL/min (ref >60)
GFR calc non Af Amer: >60 mL/min (ref >60)

## 2019-12-11 LAB — FERRITIN: Ferritin: 282 ng/mL (ref 11–307)

## 2019-12-11 LAB — PHENOBARBITAL LEVEL: Phenobarbital: 29.8 ug/mL (ref 15.0–30.0)

## 2019-12-11 LAB — LACTIC ACID, PLASMA
Lactic Acid, Venous: 1 mmol/L (ref 0.5–1.9)
Lactic Acid, Venous: 0.8 mmol/L (ref 0.5–1.9)

## 2019-12-11 LAB — POC SARS CORONAVIRUS 2 AG -  ED: SARS Coronavirus 2 Ag: POSITIVE — AB

## 2019-12-11 LAB — C-REACTIVE PROTEIN: CRP: 27.8 mg/dL — ABNORMAL HIGH (ref <1.0)

## 2019-12-11 LAB — ABO/RH: ABO/RH(D): A NEG

## 2019-12-11 LAB — PROCALCITONIN: Procalcitonin: 6.04 ng/mL

## 2019-12-11 LAB — D-DIMER, QUANTITATIVE: D-Dimer, Quant: 0.86 ug/mL-FEU — ABNORMAL HIGH (ref 0.00–0.50)

## 2019-12-11 MED ORDER — ONDANSETRON HCL 4 MG PO TABS: 4.0000 mg | ORAL_TABLET | Freq: Four times a day (QID) | ORAL | Status: DC | PRN

## 2019-12-11 MED ORDER — LEVOTHYROXINE SODIUM 75 MCG PO TABS
150.0000 ug | ORAL_TABLET | Freq: Every day | ORAL | Status: DC
  Administered 2019-12-11 – 2019-12-15 (×5): 150 ug via ORAL
  Filled 2019-12-11 (×4): qty 2

## 2019-12-11 MED ORDER — DULOXETINE HCL 30 MG PO CPEP
30.0000 mg | ORAL_CAPSULE | Freq: Two times a day (BID) | ORAL | Status: DC
  Administered 2019-12-11 – 2019-12-15 (×9): 30 mg via ORAL
  Filled 2019-12-11 (×11): qty 1

## 2019-12-11 MED ORDER — ACETAMINOPHEN 325 MG PO TABS
650.0000 mg | ORAL_TABLET | Freq: Four times a day (QID) | ORAL | Status: DC | PRN
  Administered 2019-12-13 – 2019-12-15 (×6): 650 mg via ORAL
  Filled 2019-12-11 (×7): qty 2

## 2019-12-11 MED ORDER — PHENYTOIN SODIUM EXTENDED 100 MG PO CAPS
100.0000 mg | ORAL_CAPSULE | Freq: Two times a day (BID) | ORAL | Status: DC
  Administered 2019-12-11 – 2019-12-15 (×9): 100 mg via ORAL
  Filled 2019-12-11 (×10): qty 1

## 2019-12-11 MED ORDER — ACETAMINOPHEN 325 MG PO TABS
650.0000 mg | ORAL_TABLET | Freq: Once | ORAL | Status: AC
  Administered 2019-12-11: 07:00:00 650 mg via ORAL
  Filled 2019-12-11: qty 2

## 2019-12-11 MED ORDER — ENOXAPARIN SODIUM 40 MG/0.4ML ~~LOC~~ SOLN
40.0000 mg | SUBCUTANEOUS | Status: DC
  Administered 2019-12-11 – 2019-12-15 (×5): 40 mg via SUBCUTANEOUS
  Filled 2019-12-11 (×5): qty 0.4

## 2019-12-11 MED ORDER — PRIMIDONE 250 MG PO TABS
250.0000 mg | ORAL_TABLET | Freq: Two times a day (BID) | ORAL | Status: DC
  Administered 2019-12-11 – 2019-12-13 (×5): 250 mg via ORAL
  Filled 2019-12-11 (×12): qty 1

## 2019-12-11 MED ORDER — SODIUM CHLORIDE 0.9 % IV SOLN
200.0000 mg | Freq: Once | INTRAVENOUS | Status: AC
  Administered 2019-12-11: 200 mg via INTRAVENOUS
  Filled 2019-12-11: qty 40

## 2019-12-11 MED ORDER — ASPIRIN EC 81 MG PO TBEC
81.0000 mg | DELAYED_RELEASE_TABLET | Freq: Every day | ORAL | Status: DC
  Administered 2019-12-11 – 2019-12-15 (×5): 81 mg via ORAL
  Filled 2019-12-11 (×5): qty 1

## 2019-12-11 MED ORDER — HYDROCOD POLST-CPM POLST ER 10-8 MG/5ML PO SUER: 5.0000 mL | Freq: Two times a day (BID) | ORAL | Status: DC | PRN

## 2019-12-11 MED ORDER — ONDANSETRON HCL 4 MG/2ML IJ SOLN: 4.0000 mg | Freq: Four times a day (QID) | INTRAMUSCULAR | Status: DC | PRN

## 2019-12-11 MED ORDER — METHYLPREDNISOLONE SODIUM SUCC 40 MG IJ SOLR
0.5000 mg/kg | Freq: Two times a day (BID) | INTRAMUSCULAR | Status: DC
  Administered 2019-12-11 – 2019-12-15 (×9): 38 mg via INTRAVENOUS
  Filled 2019-12-11 (×9): qty 1

## 2019-12-11 MED ORDER — SODIUM CHLORIDE 0.9 % IV SOLN
100.0000 mg | Freq: Every day | INTRAVENOUS | Status: AC
  Administered 2019-12-12 – 2019-12-15 (×4): 100 mg via INTRAVENOUS
  Filled 2019-12-11 (×7): qty 20

## 2019-12-11 MED ORDER — GUAIFENESIN-DM 100-10 MG/5ML PO SYRP
10.0000 mL | ORAL_SOLUTION | ORAL | Status: DC | PRN
  Administered 2019-12-13 – 2019-12-14 (×2): 10 mL via ORAL
  Filled 2019-12-11 (×2): qty 10

## 2019-12-11 MED ORDER — SODIUM CHLORIDE 0.9 % IV SOLN: INTRAVENOUS | Status: DC

## 2019-12-11 NOTE — ED Notes (Signed)
Pts family phone number. Kari Padilla [phone] and Cell [phone]

## 2019-12-11 NOTE — ED Triage Notes (Signed)
Pt from home and arrives via EMS. Per Ems pt O2 on RA 76%. Pt denies pain at this time. Pt COVID positive. Pt 100% on 15l NRB mask.

## 2019-12-11 NOTE — ED Notes (Signed)
Verified consent via telephone call with pt"s POA ?[name] for transfer

## 2019-12-11 NOTE — H&P (Addendum)
History and Physical    Kari Padilla LFY:[id number] DOB: [birth year] DOA: 23-Dec-2019  PCP: Kari Padilla, No Pcp Per  Kari Padilla coming from: Home  I have personally briefly reviewed Kari Padilla's old medical records in [facility]  Chief Complaint: Shortness of breath  HPI: Kari Padilla is a 73 y.o. female with medical history significant of developmental delay, seizure disorder, hypothyroidism, depression, who lives with her sister, began having fevers and sweats approximately 1 week ago.  On 12/21, she was seen by her primary care physician and was diagnosed with COVID-19.  She was prescribed azithromycin at that time.  The Kari Padilla returned home with her sister (who also has tested positive, but has largely been asymptomatic) and continued supportive care.  Kari Padilla is an unreliable historian due to her cognitive deficits and says she does not have any shortness of breath or any symptoms at this time.  After discussing with her sister, it was reported that Kari Padilla was having increasing shortness of breath and cough since yesterday.  Her sister also noted that she was more lethargic and confused in the past 24 hours.  She was also very weak and having difficulty walking.  She was having some loose stools in the past 24 hours.  She is not had any vomiting.  When her shortness of breath persisted, EMS was called.  Upon arrival of EMS, it was reported that her oxygen saturations were 76% on room air.  She is placed on a nonrebreather mask and brought to the hospital.  ED Course: In the emergency room, she was transitioned to 15 L of oxygen.  Oxygen saturations improved to 99%.  She subsequently was weaned down to 7 to 8 L of oxygen.  Chest x-ray shows bilateral pneumonia.  COVID-19 test found to be positive.  She is noted to be febrile with a temperature of 103.  Hemodynamics are otherwise stable. Basic labs including CBC and chemistry were unrevealing. Lactic acid normal at 1.0  Review of Systems: As per  HPI otherwise 10 point review of systems negative.    Past Medical History:  Diagnosis Date  . Depression   . Seizures   . Thyroid disease    hypothyroidism    Past Surgical History:  Procedure Laterality Date  . ABDOMINAL HYSTERECTOMY    . LAPAROSCOPIC APPENDECTOMY  01/06/2012   Procedure: APPENDECTOMY LAPAROSCOPIC;  Surgeon: Fabio Bering, MD;  Location: [facility];  Service: General;  Laterality: N/A;  . NECK SURGERY      Social History:  reports that she has never smoked. Her smokeless tobacco use includes snuff. She reports that she does not drink alcohol or use drugs.  Allergies  Allergen Reactions  . Codeine    Family history: Family history reviewed and not pertinent  Prior to Admission medications   Medication Sig Start Date End Date Taking? Authorizing Provider  aspirin EC 81 MG tablet Take 81 mg by mouth daily.    [provider]  DULoxetine (CYMBALTA) 30 MG capsule Take 1 capsule (30 mg total) by mouth 2 (two) times daily. 07/26/14   Deatra Canter, FNP  levothyroxine (SYNTHROID, LEVOTHROID) 150 MCG tablet Take 1 tablet (150 mcg total) by mouth daily. 07/26/14   Deatra Canter, FNP  phenytoin (DILANTIN) 100 MG ER capsule Take 1 capsule (100 mg total) by mouth 2 (two) times daily. 07/26/14   Deatra Canter, FNP  primidone (MYSOLINE) 250 MG tablet Take 1 tablet (250 mg total) by mouth 2 (two) times daily.  07/26/14   Lysbeth Penner, FNP    Physical Exam: Vitals:   12/13/2019 0600 12/06/2019 0609 11/17/2019 0621  BP: (!) 144/70    Pulse: 96    Resp: 16    Temp:   (!) 103 F (39.4 C)  TempSrc:   Axillary  SpO2: 92% 98%     Constitutional: NAD, calm, comfortable Eyes: PERRL, lids and conjunctivae normal ENMT: Mucous membranes are moist. Posterior pharynx clear of any exudate or lesions.Normal dentition.  Neck: normal, supple, no masses, no thyromegaly Respiratory: clear to auscultation bilaterally, no wheezing, no crackles. Normal respiratory effort.  No accessory muscle use.  Cardiovascular: Regular rate and rhythm, no murmurs / rubs / gallops. No extremity edema. 2+ pedal pulses. No carotid bruits.  Abdomen: no tenderness, no masses palpated. No hepatosplenomegaly. Bowel sounds positive.  Musculoskeletal: no clubbing / cyanosis. No joint deformity upper and lower extremities. Good ROM, no contractures. Normal muscle tone.  Skin: no rashes, lesions, ulcers. No induration Neurologic: CN 2-12 grossly intact. Sensation intact, DTR normal. Strength 5/5 in all 4.  Psychiatric: Answers to questions but not reliable, she is awake, pleasant   Labs on Admission: I have personally reviewed following labs and imaging studies  CBC: Recent Labs  Lab 11/27/2019 0607  WBC 8.2  NEUTROABS 7.3  HGB 11.7*  HCT 36.8  MCV 95.1  PLT 706   Basic Metabolic Panel: Recent Labs  Lab 11/28/2019 0607  NA 131*  K 4.2  CL 99  CO2 23  GLUCOSE 156*  BUN 15  CREATININE 0.75  CALCIUM 8.0*   GFR: CrCl cannot be calculated (Unknown ideal weight.). Liver Function Tests: Recent Labs  Lab 12/04/2019 0607  AST 37  ALT 26  ALKPHOS 64  BILITOT 0.4  PROT 6.6  ALBUMIN 2.8*   No results for input(s): LIPASE, AMYLASE in the last 168 hours. No results for input(s): AMMONIA in the last 168 hours. Coagulation Profile: No results for input(s): INR, PROTIME in the last 168 hours. Cardiac Enzymes: No results for input(s): CKTOTAL, CKMB, CKMBINDEX, TROPONINI in the last 168 hours. BNP (last 3 results) No results for input(s): PROBNP in the last 8760 hours. HbA1C: No results for input(s): HGBA1C in the last 72 hours. CBG: No results for input(s): GLUCAP in the last 168 hours. Lipid Profile: No results for input(s): CHOL, HDL, LDLCALC, TRIG, CHOLHDL, LDLDIRECT in the last 72 hours. Thyroid Function Tests: No results for input(s): TSH, T4TOTAL, FREET4, T3FREE, THYROIDAB in the last 72 hours. Anemia Panel: No results for input(s): VITAMINB12, FOLATE, FERRITIN,  TIBC, IRON, RETICCTPCT in the last 72 hours. Urine analysis:    Component Value Date/Time   COLORURINE YELLOW 01/05/2012 1812   APPEARANCEUR CLEAR 01/05/2012 1812   LABSPEC 1.030 01/05/2012 1812   PHURINE 5.5 01/05/2012 1812   GLUCOSEU 500 (A) 01/05/2012 1812   HGBUR NEGATIVE 01/05/2012 1812   BILIRUBINUR NEGATIVE 01/05/2012 1812   KETONESUR 15 (A) 01/05/2012 1812   PROTEINUR 30 (A) 01/05/2012 1812   UROBILINOGEN 0.2 01/05/2012 1812   NITRITE NEGATIVE 01/05/2012 1812   LEUKOCYTESUR NEGATIVE 01/05/2012 1812    Radiological Exams on Admission: DG Chest Port 1 View  Result Date: 12/14/2019 CLINICAL DATA:  73 year old female with shortness of breath. Positive COVID-19. EXAM: PORTABLE CHEST 1 VIEW COMPARISON:  Chest radiographs 10/17/2009 and earlier. FINDINGS: Portable AP upright view at 0616 hours. Lung volumes and mediastinal contours are within normal limits. There is widespread patchy and indistinct peripheral and basilar predominant bilateral pulmonary opacity, typical of  COVID-19. Pneumonia. Evidence of thyroidectomy at the thoracic inlet. Visualized tracheal air column is within normal limits. No pneumothorax, pulmonary edema or pleural effusion. No acute osseous abnormality identified. IMPRESSION: 1. Widespread bilateral patchy and indistinct peripheral and basilar predominant opacity typical of COVID-19 pneumonia. 2. Evidence of prior thyroidectomy. Electronically Signed   By: Odessa FlemingH  Hall M.D.   On: Oct 25, 2019 06:32    EKG: Independently reviewed.  Sinus rhythm without acute changes  Assessment/Plan Active Problems:   Seizures (HCC)   Acute respiratory failure with hypoxia (HCC)   Pneumonia due to COVID-19 virus   Hypothyroidism   Depression   DNR (do not resuscitate)    1. Acute respiratory failure with hypoxia. Secondary to pneumonia. Currently requiring 7-8L oxygen. Will continue to wean down as tolerated 2. COVID 19 pneumonia. Kari Padilla found to have bilateral infiltrates on  chest xray. She has been started on remdesivir and IV steroids. Will check CRP, Ferritin, LDH and D dimer. If elevated, can consider giving Actemra 3. Seizure disorder. Continue on dilantin and primidone 4. Hypothyroidism. Continue on synthroid 5. Depression. Continue cymbalta  DVT prophylaxis: Lovenox Code Status: DNR, present on admission Family Communication: Discussed with sister, Maralyn SagoSarah who is her power of attorney [facility] Consults called:  Admission status: Inpatient, progressive.  Based on current presentation, severity of respiratory failure and need for intravenous therapies including steroids and remdesivir, I anticipate the Kari Padilla will need to stay in the hospital more than 2 midnights.  Erick BlinksJehanzeb Giovanny Dugal MD [facility]   If 7PM-7AM, please contact night-coverage [email or web address]   Oct 25, 2019, 9:24 AM   Addendum:  Upon reevaluation, Kari Padilla CRP noted to be elevated at 24.  Her procalcitonin also elevated at 6.  Her hemodynamic status is stable.  She does not have any obvious source of bacterial infection.  WBC count is also normal.  We will continue with current treatments.  Her oxygen requirement has been able to be titrated down from 8 L down to 5 L.  We will hold on giving Actemra for now.  Darden RestaurantsJehanzeb Soniya Ashraf

## 2019-12-11 NOTE — ED Notes (Signed)
Unable to complete triage due to patient's altered mental status.

## 2019-12-11 NOTE — ED Provider Notes (Signed)
[facility] EMERGENCY DEPARTMENT Provider Note   CSN: [id number] Arrival date & time: 12/17/2019  7829     History Chief Complaint  Patient presents with  . Shortness of Breath    Covid +    Kari Padilla is a 73 y.o. female.  HPI   She presents by EMS for evaluation of difficulty breathing with reported positive Covid status.  She is unable to give history.  She does not know that she has an infection.  Level 5 caveat-altered mental status   Past Medical History:  Diagnosis Date  . Depression   . Seizures   . Thyroid disease    hypothyroidism    Patient Active Problem List   Diagnosis Date Noted  . Thyroid disease   . Seizures ([place])   . HIP PAIN 07/14/2008  . LUMBAR RADICULOPATHY, LEFT 07/14/2008  . CLOSED FRACTURE OF SURGICAL NECK OF HUMERUS 01/21/2008    Past Surgical History:  Procedure Laterality Date  . ABDOMINAL HYSTERECTOMY    . LAPAROSCOPIC APPENDECTOMY  01/06/2012   Procedure: APPENDECTOMY LAPAROSCOPIC;  Surgeon: Donato Heinz, MD;  Location: [facility];  Service: General;  Laterality: N/A;  . NECK SURGERY       OB History   No obstetric history on file.     No family history on file.  Social History   Tobacco Use  . Smoking status: Never Smoker  . Smokeless tobacco: Current User    Types: Snuff  Substance Use Topics  . Alcohol use: No  . Drug use: No    Home Medications Prior to Admission medications   Medication Sig Start Date End Date Taking? Authorizing Provider  aspirin EC 81 MG tablet Take 81 mg by mouth daily.    [provider]  DULoxetine (CYMBALTA) 30 MG capsule Take 1 capsule (30 mg total) by mouth 2 (two) times daily. 07/26/14   Lysbeth Penner, FNP  levothyroxine (SYNTHROID, LEVOTHROID) 150 MCG tablet Take 1 tablet (150 mcg total) by mouth daily. 07/26/14   Lysbeth Penner, FNP  phenytoin (DILANTIN) 100 MG ER capsule Take 1 capsule (100 mg total) by mouth 2 (two) times daily. 07/26/14   Lysbeth Penner, FNP    primidone (MYSOLINE) 250 MG tablet Take 1 tablet (250 mg total) by mouth 2 (two) times daily. 07/26/14   Lysbeth Penner, FNP    Allergies    Codeine  Review of Systems   Review of Systems  Unable to perform ROS: Mental status change    Physical Exam Updated Vital Signs BP (!) 144/70   Pulse 96   Temp (!) 103 F (39.4 C) (Axillary)   Resp 16   SpO2 98%   Physical Exam Vitals and nursing note reviewed.  Constitutional:      General: She is in acute distress.     Appearance: She is well-developed. She is ill-appearing. She is not toxic-appearing or diaphoretic.  HENT:     Head: Normocephalic and atraumatic.     Right Ear: External ear normal.     Left Ear: External ear normal.  Eyes:     Conjunctiva/sclera: Conjunctivae normal.     Pupils: Pupils are equal, round, and reactive to light.  Neck:     Trachea: Phonation normal.  Cardiovascular:     Rate and Rhythm: Regular rhythm. Tachycardia present.     Pulses: Normal pulses.  Pulmonary:     Effort: Pulmonary effort is normal.     Comments: Tachypnea Abdominal:  Palpations: Abdomen is soft.     Tenderness: There is no abdominal tenderness.  Musculoskeletal:        General: No swelling or tenderness. Normal range of motion.     Cervical back: Normal range of motion and neck supple.  Skin:    General: Skin is warm and dry.  Neurological:     Mental Status: She is alert.     Cranial Nerves: No cranial nerve deficit.     Motor: No abnormal muscle tone.     Coordination: Coordination normal.     Comments: No dysarthria, or aphasia.  She is confused.  She is responsive and follows commands.  Psychiatric:        Mood and Affect: Mood normal.        Behavior: Behavior normal.     ED Results / Procedures / Treatments   Labs (all labs ordered are listed, but only abnormal results are displayed) Labs Reviewed  COMPREHENSIVE METABOLIC PANEL - Abnormal; Notable for the following components:      Result Value    Sodium 131 (*)    Glucose, Bld 156 (*)    Calcium 8.0 (*)    Albumin 2.8 (*)    All other components within normal limits  CBC WITH DIFFERENTIAL/PLATELET - Abnormal; Notable for the following components:   Hemoglobin 11.7 (*)    Lymphs Abs 0.6 (*)    All other components within normal limits  BLOOD GAS, VENOUS - Abnormal; Notable for the following components:   Acid-Base Excess 4.4 (*)    All other components within normal limits  POC SARS CORONAVIRUS 2 AG -  ED - Abnormal; Notable for the following components:   SARS Coronavirus 2 Ag POSITIVE (*)    All other components within normal limits  CULTURE, BLOOD (ROUTINE X 2)  CULTURE, BLOOD (ROUTINE X 2)  LACTIC ACID, PLASMA  PHENOBARBITAL LEVEL  PHENYTOIN LEVEL, TOTAL  LACTIC ACID, PLASMA  URINALYSIS, ROUTINE W REFLEX MICROSCOPIC    EKG EKG Interpretation  Date/Time:  Friday December 11 2019 06:01:16 EST Ventricular Rate:  96 PR Interval:    QRS Duration: 113 QT Interval:  321 QTC Calculation: 406 R Axis:   60 Text Interpretation: Sinus rhythm Borderline intraventricular conduction delay Borderline repolarization abnormality Baseline wander in lead(s) II since last tracing no significant change Confirmed by Daleen Bo [phone]) on 12/07/2019 6:10:35 AM   Radiology DG Chest Port 1 View  Result Date: 11/28/2019 CLINICAL DATA:  73 year old female with shortness of breath. Positive COVID-19. EXAM: PORTABLE CHEST 1 VIEW COMPARISON:  Chest radiographs 10/17/2009 and earlier. FINDINGS: Portable AP upright view at 0616 hours. Lung volumes and mediastinal contours are within normal limits. There is widespread patchy and indistinct peripheral and basilar predominant bilateral pulmonary opacity, typical of COVID-19. Pneumonia. Evidence of thyroidectomy at the thoracic inlet. Visualized tracheal air column is within normal limits. No pneumothorax, pulmonary edema or pleural effusion. No acute osseous abnormality identified. IMPRESSION: 1.  Widespread bilateral patchy and indistinct peripheral and basilar predominant opacity typical of COVID-19 pneumonia. 2. Evidence of prior thyroidectomy. Electronically Signed   By: Genevie Ann M.D.   On: 12/08/2019 06:32    Procedures .Critical Care Performed by: Daleen Bo, MD Authorized by: Daleen Bo, MD   Critical care provider statement:    Critical care time (minutes):  35   Critical care start time:  12/12/2019 5:45 AM   Critical care end time:  12/12/2019 7:57 AM   Critical care time was exclusive of:  Separately billable procedures and treating other patients   Critical care was necessary to treat or prevent imminent or life-threatening deterioration of the following conditions:  Respiratory failure   Critical care was time spent personally by me on the following activities:  Blood draw for specimens, development of treatment plan with patient or surrogate, discussions with consultants, evaluation of patient's response to treatment, examination of patient, obtaining history from patient or surrogate, ordering and performing treatments and interventions, ordering and review of laboratory studies, pulse oximetry, re-evaluation of patient's condition, review of old charts and ordering and review of radiographic studies   (including critical care time)  Medications Ordered in ED Medications  0.9 %  sodium chloride infusion (has no administration in time range)  acetaminophen (TYLENOL) tablet 650 mg (650 mg Oral Given 11/25/2019 2353)    ED Course  I have reviewed the triage vital signs and the nursing notes.  Pertinent labs & imaging results that were available during my care of the patient were reviewed by me and considered in my medical decision making (see chart for details).  Clinical Course as of Dec 10 756  Fri Dec 11, 2019  0609 Initial resuscitation to place patient on nasal cannula oxygen, high flow at 15 L.  Oxygen saturation at this time, 99%   [EW]  0618 I was  able to reach the patient's sister-in-law, Reine Just.  Her cell phone number is [phone].  She states that both she, and the patient have been diagnosed with COVID-19, since 12/07/2019.  Judson Roch became concerned today because the patient was having trouble "staying awake or eating."  Typically the patient is "mentally challenged, and requires a lot of help with both walking, and her daily activities.  Earlier yesterday the patient refused transport to the hospital, but tonight so was concerned that her breathing was worse, so called an ambulance.  There have been no end-of-life decisions made, and Judson Roch states that she would be willing to have the patient be intubated, "for a little while, if needed."   [EW]  0631 Abnormal, COVID-19 infection  POC SARS Coronavirus 2 Ag-ED - Nasal Swab (BD Veritor Kit)(!) [EW]  8676 Multifocal pneumonia, interpreted by me  DG Chest Port 1 View [EW]  [phone] POC SARS Coronavirus 2 Ag-ED - Nasal Swab (BD Veritor Kit)(!) [EW]  0717 Normal except hemoglobin low  CBC with Differential(!) [EW]  0717 Normal  Dilantin Level [EW]  0717 Normal except sodium low, glucose high, calcium low, albumin low  Comprehensive metabolic panel(!) [EW]  9326 Chloride: 99 [EW]    Clinical Course User Index [EW] Daleen Bo, MD   MDM Rules/Calculators/A&P                       Patient Vitals for the past 24 hrs:  BP Temp Temp src Pulse Resp SpO2  12/10/2019 0621 -- (!) 103 F (39.4 C) Axillary -- -- --  11/29/2019 0609 -- -- -- -- -- 98 %  12/09/2019 0600 (!) 144/70 -- -- 96 16 92 %    7:23 AM Reevaluation with update and discussion. After initial assessment and treatment, an updated evaluation reveals she continues to be responsive and communicative but tends to fall asleep.  Oxygen saturation 98% on 8 L nasal cannula... Daleen Bo   Medical Decision Making: Acute respiratory failure secondary to COVID-19 infection.  Patient responds to nasal cannula oxygen appropriately.   No evidence for bacterial pneumonia.  No metabolic instability.  She will require  hospitalization for treatment and oxygenation purposes.  [place] was evaluated in Emergency Department on 11/21/2019 for the symptoms described in the history of present illness. She was evaluated in the context of the global COVID-19 pandemic, which necessitated consideration that the patient might be at risk for infection with the SARS-CoV-2 virus that causes COVID-19. Institutional protocols and algorithms that pertain to the evaluation of patients at risk for COVID-19 are in a state of rapid change based on information released by regulatory bodies including the CDC and federal and state organizations. These policies and algorithms were followed during the patient's care in the ED. [place] was evaluated in Emergency Department on 12/16/2019 for the symptoms described in the history of present illness. She was evaluated in the context of the global COVID-19 pandemic, which necessitated consideration that the patient might be at risk for infection with the SARS-CoV-2 virus that causes COVID-19. Institutional protocols and algorithms that pertain to the evaluation of patients at risk for COVID-19 are in a state of rapid change based on information released by regulatory bodies including the CDC and federal and state organizations. These policies and algorithms were followed during the patient's care in the ED.   CRITICAL CARE-yes Performed by: Daleen Bo  Nursing Notes Reviewed/ Care Coordinated Applicable Imaging Reviewed Interpretation of Laboratory Data incorporated into ED treatment  7:25 AM-Consult complete with hospitalist. Patient case explained and discussed.  He agrees to admit patient for further evaluation and treatment. Call ended at 7:50 AM  Plan: Admit  Final Clinical Impression(s) / ED Diagnoses Final diagnoses:  COVID-19 virus infection  Acute respiratory failure with hypoxia  [facility])    Rx / DC Orders ED Discharge Orders    None       Daleen Bo, MD 12/05/2019 [phone]

## 2019-12-12 ENCOUNTER — Encounter (HOSPITAL_COMMUNITY): Payer: Self-pay | Admitting: Internal Medicine

## 2019-12-12 DIAGNOSIS — U071 COVID-19: Principal | ICD-10-CM

## 2019-12-12 DIAGNOSIS — J1289 Other viral pneumonia: Secondary | ICD-10-CM

## 2019-12-12 DIAGNOSIS — J9601 Acute respiratory failure with hypoxia: Secondary | ICD-10-CM

## 2019-12-12 MED ORDER — LOPERAMIDE HCL 2 MG PO CAPS
2.0000 mg | ORAL_CAPSULE | Freq: Four times a day (QID) | ORAL | Status: DC | PRN
  Administered 2019-12-12: 15:00:00 2 mg via ORAL
  Filled 2019-12-12: qty 1

## 2019-12-12 MED ORDER — ENSURE ENLIVE PO LIQD
237.0000 mL | Freq: Three times a day (TID) | ORAL | Status: DC
  Administered 2019-12-12 – 2019-12-15 (×5): 237 mL via ORAL

## 2019-12-12 NOTE — Progress Notes (Signed)
Pt admitted from [facility] received report from [facility]. Pt on 10L HFNC, febrile, and no complaints of pain.

## 2019-12-12 NOTE — Plan of Care (Signed)
  Problem: Education: Goal: Knowledge of risk factors and measures for prevention of condition will improve Outcome: Not Met (add Reason)   Problem: Coping: Goal: Psychosocial and spiritual needs will be supported Outcome: Not Met (add Reason)   Problem: Respiratory: Goal: Will maintain a patent airway Outcome: Not Met (add Reason) Goal: Complications related to the disease process, condition or treatment will be avoided or minimized Outcome: Not Met (add Reason)

## 2019-12-12 NOTE — Progress Notes (Signed)
PROGRESS NOTE    Kari Padilla  GEX:[id number] DOB: 03-14-1946 DOA: 01-02-20 PCP: Patient, No Pcp Per    Brief Narrative:  73 year old female with history of developmental delay, seizure disorder, hypothyroidism and depression brought to emergency room with about 1 week of symptoms and diagnosed with COVID-19 on 12/21.  Patient was having increasing shortness of breath and cough so brought to ER.  In the emergency room 76% on room air, placed on nonrebreather mask and admitted.  Transfer to [place]:   Active Problems:   Seizures ([place])   Acute respiratory failure with hypoxia ([facility])   Pneumonia due to COVID-19 virus   Hypothyroidism   Depression   DNR (do not resuscitate)  Pneumonia due to COVID-19 virus with acute hypoxemic respiratory failure: Continue to monitor due to significant symptoms  chest physiotherapy, incentive spirometry, deep breathing exercises, sputum induction, mucolytic's and bronchodilators. Supplemental oxygen to keep saturations more than 90%. Covid directed therapy with , steroids, on Solu-Medrol remdesivir, day 2/5 actemra, not given due to high procalcitonin antibiotics, not indicated.  No evidence of bacterial infection. Due to severity of symptoms, patient will need daily inflammatory markers, chest x-rays, liver function test to monitor and direct COVID-19 therapies.  History of seizure disorder: No evidence of new seizure.  Remains on phenytoin.  Hypothyroidism: On Synthroid.  Continue.  Depression: On Cymbalta that she will continue.   DVT prophylaxis: Lovenox Code Status: DNR Family Communication: None, unable to call the number on the chart. Disposition Plan: Remains critically sick in the hospital.   Consultants:   None  Procedures:   None  Antimicrobials:  Anti-infectives (From admission, onward)   Start     Dose/Rate Route Frequency Ordered Stop   12/12/19 1000  remdesivir 100 mg in sodium  chloride 0.9 % 100 mL IVPB     100 mg 200 mL/hr over 30 Minutes Intravenous Daily January 02, 2020 0923 12/16/19 0959   01-02-2020 1000  remdesivir 200 mg in sodium chloride 0.9% 250 mL IVPB     200 mg 580 mL/hr over 30 Minutes Intravenous Once Jan 02, 2020 0102 01/02/20 1736         Subjective: Patient seen and examined.  Poor historian.  Only thing she tells me is she is coughing.  Patient is anywhere on 5 to 10 L of oxygen.  Temperature maximum 102.4.  Objective: Vitals:   12/12/19 0500 12/12/19 0800 12/12/19 0900 12/12/19 1058  BP: 138/61 (!) 149/65 (!) 154/64   Pulse: 83  86 93  Resp: 20   18  Temp: (!) 100.4 F (38 C)   (!) 102.4 F (39.1 C)  TempSrc: Oral   Axillary  SpO2: 96%   92%  Weight: 80.1 kg     Height: 5' (1.524 m)       Intake/Output Summary (Last 24 hours) at 12/12/2019 1153 Last data filed at 12/12/2019 0600 Gross per 24 hour  Intake 120 ml  Output --  Net 120 ml   Filed Weights   01-02-20 0900 12/12/19 0500  Weight: 75.8 kg 80.1 kg    Examination:  General exam: Appears calm and comfortable, chronically sick looking, on high flow oxygen however without any distress. Respiratory system: Clear to auscultation. Respiratory effort normal.  Some conducted airway sounds. Cardiovascular system: S1 & S2 heard, RRR. No JVD, murmurs, rubs, gallops or clicks. No pedal edema. Gastrointestinal system: Abdomen is nondistended, soft and nontender. No organomegaly or masses felt. Normal bowel sounds heard. Central  nervous system: Alert and oriented. No focal neurological deficits. Extremities: Symmetric 5 x 5 power. Skin: No rashes, lesions or ulcers Psychiatry: Judgement and insight appear normal. Mood & affect appropriate.     Data Reviewed: I have personally reviewed following labs and imaging studies  CBC: Recent Labs  Lab 12-21-2019 0607 2019-12-21 0949  WBC 8.2 8.8  NEUTROABS 7.3  --   HGB 11.7* 11.2*  HCT 36.8 35.0*  MCV 95.1 94.9  PLT 236 222   Basic  Metabolic Panel: Recent Labs  Lab 12-21-2019 0607 2019-12-21 0949  NA 131*  --   K 4.2  --   CL 99  --   CO2 23  --   GLUCOSE 156*  --   BUN 15  --   CREATININE 0.75 0.70  CALCIUM 8.0*  --    GFR: Estimated Creatinine Clearance: 57.8 mL/min (by C-G formula based on SCr of 0.7 mg/dL). Liver Function Tests: Recent Labs  Lab 12-21-19 0607  AST 37  ALT 26  ALKPHOS 64  BILITOT 0.4  PROT 6.6  ALBUMIN 2.8*   No results for input(s): LIPASE, AMYLASE in the last 168 hours. No results for input(s): AMMONIA in the last 168 hours. Coagulation Profile: No results for input(s): INR, PROTIME in the last 168 hours. Cardiac Enzymes: No results for input(s): CKTOTAL, CKMB, CKMBINDEX, TROPONINI in the last 168 hours. BNP (last 3 results) No results for input(s): PROBNP in the last 8760 hours. HbA1C: No results for input(s): HGBA1C in the last 72 hours. CBG: No results for input(s): GLUCAP in the last 168 hours. Lipid Profile: No results for input(s): CHOL, HDL, LDLCALC, TRIG, CHOLHDL, LDLDIRECT in the last 72 hours. Thyroid Function Tests: No results for input(s): TSH, T4TOTAL, FREET4, T3FREE, THYROIDAB in the last 72 hours. Anemia Panel: Recent Labs    2019-12-21 0949  FERRITIN 282   Sepsis Labs: Recent Labs  Lab 21-Dec-2019 2951 12-21-2019 0923 21-Dec-2019 0949  PROCALCITON  --   --  6.04  LATICACIDVEN 1.0 0.8  --     Recent Results (from the past 240 hour(s))  Culture, blood (routine x 2)     Status: None (Preliminary result)   Collection Time: 21-Dec-2019  6:34 AM   Specimen: BLOOD RIGHT ARM  Result Value Ref Range Status   Specimen Description BLOOD RIGHT ARM  Final   Special Requests   Final    BOTTLES DRAWN AEROBIC AND ANAEROBIC Blood Culture adequate volume   Culture   Final    NO GROWTH < 24 HOURS Performed at [facility], [address], [place], [place] [address]    Report Status PENDING  Incomplete  Culture, blood (routine x 2)     Status: None (Preliminary result)    Collection Time: 12/21/2019  6:43 AM   Specimen: BLOOD RIGHT ARM  Result Value Ref Range Status   Specimen Description BLOOD RIGHT ARM  Final   Special Requests   Final    BOTTLES DRAWN AEROBIC AND ANAEROBIC Blood Culture adequate volume   Culture   Final    NO GROWTH < 24 HOURS Performed at [facility], [address], [place], [place] [address]    Report Status PENDING  Incomplete         Radiology Studies: DG Chest Port 1 View  Result Date: 12/21/19 CLINICAL DATA:  73 year old female with shortness of breath. Positive COVID-19. EXAM: PORTABLE CHEST 1 VIEW COMPARISON:  Chest radiographs 10/17/2009 and earlier. FINDINGS: Portable AP upright view at 0616 hours.  Lung volumes and mediastinal contours are within normal limits. There is widespread patchy and indistinct peripheral and basilar predominant bilateral pulmonary opacity, typical of COVID-19. Pneumonia. Evidence of thyroidectomy at the thoracic inlet. Visualized tracheal air column is within normal limits. No pneumothorax, pulmonary edema or pleural effusion. No acute osseous abnormality identified. IMPRESSION: 1. Widespread bilateral patchy and indistinct peripheral and basilar predominant opacity typical of COVID-19 pneumonia. 2. Evidence of prior thyroidectomy. Electronically Signed   By: Odessa FlemingH  Hall M.D.   On: 17-Feb-2019 06:32        Scheduled Meds: . aspirin EC  81 mg Oral Daily  . DULoxetine  30 mg Oral BID  . enoxaparin (LOVENOX) injection  40 mg Subcutaneous Q24H  . levothyroxine  150 mcg Oral Daily  . methylPREDNISolone (SOLU-MEDROL) injection  0.5 mg/kg Intravenous Q12H  . phenytoin  100 mg Oral BID  . primidone  250 mg Oral BID   Continuous Infusions: . remdesivir 100 mg in NS 100 mL 100 mg (12/12/19 1039)     LOS: 1 day    Time spent: 35 minutes    Dorcas CarrowKuber Yalexa Blust, MD [facility] Pager [phone]

## 2019-12-12 NOTE — Progress Notes (Signed)
Pt remained on 12-13L/HFNC throughout shift 89-93%)  Pt was febrile & resolved slowly with tylenol. MD Ghimire was aware. Remdesivir course continued IV steroids given SQ lovenox    Pt did have a large liquid BM this afternoon. Pt bathed, linens changed & imodium given.  External catheter now in place. Call bell within reach. **Pt does well with eating if you sit her up completely, cut lights on, open things for her & cut meat. Pt A/Ox1-2 for me.

## 2019-12-13 ENCOUNTER — Inpatient Hospital Stay (HOSPITAL_COMMUNITY): Payer: Medicare Other

## 2019-12-13 LAB — COMPREHENSIVE METABOLIC PANEL
ALT: 22 U/L (ref 0–44)
AST: 33 U/L (ref 15–41)
Albumin: 2.5 g/dL — ABNORMAL LOW (ref 3.5–5.0)
Alkaline Phosphatase: 62 U/L (ref 38–126)
Anion gap: 14 (ref 5–15)
BUN: 18 mg/dL (ref 8–23)
CO2: 28 mmol/L (ref 22–32)
Calcium: 7.8 mg/dL — ABNORMAL LOW (ref 8.9–10.3)
Chloride: 95 mmol/L — ABNORMAL LOW (ref 98–111)
Creatinine, Ser: 0.53 mg/dL (ref 0.44–1.00)
GFR calc Af Amer: >60 mL/min (ref >60)
GFR calc non Af Amer: >60 mL/min (ref >60)
Glucose, Bld: 126 mg/dL — ABNORMAL HIGH (ref 70–99)
Potassium: 4.6 mmol/L (ref 3.5–5.1)
Sodium: 137 mmol/L (ref 135–145)
Total Bilirubin: 0.6 mg/dL (ref 0.3–1.2)
Total Protein: 6.7 g/dL (ref 6.5–8.1)

## 2019-12-13 LAB — CBC WITH DIFFERENTIAL/PLATELET
Abs Immature Granulocytes: 0.08 10*3/uL — ABNORMAL HIGH (ref 0.00–0.07)
Basophils Absolute: 0 10*3/uL (ref 0.0–0.1)
Basophils Relative: 0 %
Eosinophils Absolute: 0 10*3/uL (ref 0.0–0.5)
Eosinophils Relative: 0 %
HCT: 37.2 % (ref 36.0–46.0)
Hemoglobin: 11.6 g/dL — ABNORMAL LOW (ref 12.0–15.0)
Immature Granulocytes: 1 %
Lymphocytes Relative: 7 %
Lymphs Abs: 0.7 10*3/uL (ref 0.7–4.0)
MCH: 29.9 pg (ref 26.0–34.0)
MCHC: 31.2 g/dL (ref 30.0–36.0)
MCV: 95.9 fL (ref 80.0–100.0)
Monocytes Absolute: 0.5 10*3/uL (ref 0.1–1.0)
Monocytes Relative: 5 %
Neutro Abs: 7.9 10*3/uL — ABNORMAL HIGH (ref 1.7–7.7)
Neutrophils Relative %: 87 %
Platelets: 302 10*3/uL (ref 150–400)
RBC: 3.88 MIL/uL (ref 3.87–5.11)
RDW: 14.3 % (ref 11.5–15.5)
WBC: 9.2 10*3/uL (ref 4.0–10.5)
nRBC: 0 % (ref 0.0–0.2)

## 2019-12-13 LAB — D-DIMER, QUANTITATIVE: D-Dimer, Quant: 1.71 ug/mL-FEU — ABNORMAL HIGH (ref 0.00–0.50)

## 2019-12-13 LAB — PROCALCITONIN: Procalcitonin: 5.79 ng/mL

## 2019-12-13 LAB — FERRITIN: Ferritin: 257 ng/mL (ref 11–307)

## 2019-12-13 LAB — C-REACTIVE PROTEIN: CRP: 37.5 mg/dL — ABNORMAL HIGH (ref <1.0)

## 2019-12-13 MED ORDER — SODIUM CHLORIDE 0.9 % IV SOLN
2.0000 g | INTRAVENOUS | Status: DC
  Administered 2019-12-13 – 2019-12-15 (×3): 2 g via INTRAVENOUS
  Filled 2019-12-13 (×3): qty 20

## 2019-12-13 MED ORDER — SODIUM CHLORIDE 0.9 % IV SOLN
500.0000 mg | INTRAVENOUS | Status: DC
  Administered 2019-12-13 – 2019-12-14 (×2): 500 mg via INTRAVENOUS
  Filled 2019-12-13 (×4): qty 500

## 2019-12-13 MED ORDER — FUROSEMIDE 8 MG/ML PO SOLN
40.0000 mg | Freq: Once | ORAL | Status: DC
  Filled 2019-12-13: qty 5

## 2019-12-13 MED ORDER — FUROSEMIDE 10 MG/ML IJ SOLN
40.0000 mg | Freq: Once | INTRAMUSCULAR | Status: AC
  Administered 2019-12-13: 17:00:00 40 mg via INTRAVENOUS
  Filled 2019-12-13: qty 4

## 2019-12-13 NOTE — Plan of Care (Signed)
  Problem: Education: Goal: Knowledge of risk factors and measures for prevention of condition will improve Outcome: Not Progressing   Problem: Coping: Goal: Psychosocial and spiritual needs will be supported Outcome: Not Progressing   Problem: Respiratory: Goal: Will maintain a patent airway Outcome: Not Progressing Goal: Complications related to the disease process, condition or treatment will be avoided or minimized Outcome: Not Progressing   

## 2019-12-13 NOTE — Progress Notes (Signed)
Initial Nutrition Assessment  DOCUMENTATION CODES:   Obesity unspecified  INTERVENTION:   -Ensure Enlive po TID, each supplement provides 350 kcal and 20 grams of protein -Will add chopped meats to diet order  -Pt receiving Magic cup BID with lunch and dinner, each supplement provides 290 kcal and 9 grams of protein, automatically on meal trays to optimize nutritional intake.   NUTRITION DIAGNOSIS:   Increased nutrient needs related to acute illness as evidenced by estimated needs.  GOAL:   Patient will meet greater than or equal to 90% of their needs  MONITOR:   PO intake, Supplement acceptance, Labs, Weight trends, I & O's  REASON FOR ASSESSMENT:   Consult Assessment of nutrition requirement/status  ASSESSMENT:   73 year old female with history of developmental delay, seizure disorder, hypothyroidism and depression brought to emergency room with about 1 week of symptoms and diagnosed with COVID-19 on 12/21.  Patient was having increasing shortness of breath and cough so brought to ER.  In the emergency room 76% on room air, placed on nonrebreather mask and admitted.  Transfer to [facility].  **RD working remotely**  Patient currently alert/oriented x 2. Poor historian.  Per chart review, pt began having symptoms 1 week PTA. Pt was having loose stools 1 day PTA.  Per documentation pt is consuming 25-50% of meals since admission. Pt has been ordered Ensure TID, pt is drinking these at this time.  Per nursing notes, pt eats well when trays are set up, items are opened and meats are chopped. Will add chopped meats to diet order.  No weight loss per weight records. Admission weight: 176 lbs. Current weight: 182 lbs.  I/Os: +220 ml since admit UOP: 600 ml x 24 hrs  Labs reviewed. Medications reviewed.  NUTRITION - FOCUSED PHYSICAL EXAM:  Working remotely.  Diet Order:   Diet Order            Diet regular Room service appropriate? Yes; Fluid consistency:  Thin  Diet effective now              EDUCATION NEEDS:   No education needs have been identified at this time  Skin:  Skin Assessment: Reviewed RN Assessment  Last BM:  12/26 -type 7  Height:   Ht Readings from Last 1 Encounters:  12/12/19 5' (1.524 m)    Weight:   Wt Readings from Last 1 Encounters:  12/13/19 83 kg    Ideal Body Weight:  45.4 kg  BMI:  Body mass index is 35.74 kg/m.  Estimated Nutritional Needs:   Kcal:  1650-1850  Protein:  70-80g  Fluid:  1.8L/day  Kari Bibles, MS, RD, LDN Inpatient Clinical Dietitian Pager: [phone] After Hours Pager: [phone]

## 2019-12-13 NOTE — Progress Notes (Signed)
PROGRESS NOTE    CAELI LINEHAN  RKY:[id number] DOB: August 27, 1946 DOA: 12/10/2019 PCP: Patient, No Pcp Per    Brief Narrative:  73 year old female with history of developmental delay, seizure disorder, hypothyroidism and depression brought to emergency room with about 1 week of symptoms and diagnosed with COVID-19 on 12/21.  Patient was having increasing shortness of breath and cough so brought to ER.  In the emergency room 76% on room air, placed on nonrebreather mask and admitted.  Transfer to [place]:   Active Problems:   Seizures ([place])   Acute respiratory failure with hypoxia (HCC)   Pneumonia due to COVID-19 virus   Hypothyroidism   Depression   DNR (do not resuscitate)  Pneumonia due to COVID-19 virus with acute hypoxemic respiratory failure: Continue to monitor due to significant symptoms, still remains on significant oxygen. chest physiotherapy, incentive spirometry, deep breathing exercises, sputum induction, mucolytic's and bronchodilators.  Try to keep prone as much as possible. Supplemental oxygen to keep saturations more than 90%. Covid directed therapy with , steroids, on Solu-Medrol remdesivir, day 3/5 actemra, not given due to high procalcitonin, patient with increasing oxygen requirement, will recheck procalcitonin and if is still high, will start antibiotics and discussed with her power of attorney about Actemra doses. antibiotics, recheck procalcitonin. Due to severity of symptoms, patient will need daily inflammatory markers, chest x-rays, liver function test to monitor and direct COVID-19 therapies. Repeat chest x-ray with slightly worsening pneumonic process.  History of seizure disorder: No evidence of new seizure.  Remains on phenytoin.  Hypothyroidism: On Synthroid.  Continue.  Depression: On Cymbalta that she will continue.   DVT prophylaxis: Lovenox Code Status: DNR Family Communication: None.  Will talk to  patient's sister. Disposition Plan: Remains critically sick in the hospital.   Consultants:   None  Procedures:   None  Antimicrobials:  Anti-infectives (From admission, onward)   Start     Dose/Rate Route Frequency Ordered Stop   12/12/19 1000  remdesivir 100 mg in sodium chloride 0.9 % 100 mL IVPB     100 mg 200 mL/hr over 30 Minutes Intravenous Daily 12/17/2019 0923 12/16/19 0959   12/14/2019 1000  remdesivir 200 mg in sodium chloride 0.9% 250 mL IVPB     200 mg 580 mL/hr over 30 Minutes Intravenous Once 11/26/2019 3151 11/26/2019 1736         Subjective: Patient seen and examined.  Herself denies any complaints other than coughing.  Remains febrile overnight.  On 15 L of high flow oxygen.  Objective: Vitals:   12/13/19 0300 12/13/19 0343 12/13/19 0500 12/13/19 0740  BP:  (!) 165/77  (!) 136/59  Pulse: 96     Resp: 18 18  (!) 24  Temp:  (!) 102.7 F (39.3 C)  99 F (37.2 C)  TempSrc:  Axillary  Axillary  SpO2: (!) 88% (!) 87%    Weight:   83 kg   Height:        Intake/Output Summary (Last 24 hours) at 12/13/2019 1109 Last data filed at 12/13/2019 0100 Gross per 24 hour  Intake 480 ml  Output 600 ml  Net -120 ml   Filed Weights   12/02/2019 0900 12/12/19 0500 12/13/19 0500  Weight: 75.8 kg 80.1 kg 83 kg    Examination:  General exam: Appears calm and comfortable, chronically sick looking, on high flow oxygen however talking and able to eat breakfast with no trouble. Respiratory system: Bilateral poor air entry.  Mild respiratory distress.  Some conducted airway sounds. Cardiovascular system: S1 & S2 heard, RRR. No JVD, murmurs, rubs, gallops or clicks. No pedal edema. Gastrointestinal system: Abdomen is nondistended, soft and nontender. No organomegaly or masses felt. Normal bowel sounds heard. Central nervous system: Alert and oriented. No focal neurological deficits. Extremities: Symmetric 5 x 5 power. Skin: No rashes, lesions or ulcers Psychiatry: Judgement  and insight appear normal. Mood & affect appropriate.     Data Reviewed: I have personally reviewed following labs and imaging studies  CBC: Recent Labs  Lab 2019/12/25 0607 Dec 25, 2019 0949 12/13/19 0148  WBC 8.2 8.8 9.2  NEUTROABS 7.3  --  7.9*  HGB 11.7* 11.2* 11.6*  HCT 36.8 35.0* 37.2  MCV 95.1 94.9 95.9  PLT 236 222 302   Basic Metabolic Panel: Recent Labs  Lab December 25, 2019 0607 25-Dec-2019 0949 12/13/19 0148  NA 131*  --  137  K 4.2  --  4.6  CL 99  --  95*  CO2 23  --  28  GLUCOSE 156*  --  126*  BUN 15  --  18  CREATININE 0.75 0.70 0.53  CALCIUM 8.0*  --  7.8*   GFR: Estimated Creatinine Clearance: 58.9 mL/min (by C-G formula based on SCr of 0.53 mg/dL). Liver Function Tests: Recent Labs  Lab Dec 25, 2019 0607 12/13/19 0148  AST 37 33  ALT 26 22  ALKPHOS 64 62  BILITOT 0.4 0.6  PROT 6.6 6.7  ALBUMIN 2.8* 2.5*   No results for input(s): LIPASE, AMYLASE in the last 168 hours. No results for input(s): AMMONIA in the last 168 hours. Coagulation Profile: No results for input(s): INR, PROTIME in the last 168 hours. Cardiac Enzymes: No results for input(s): CKTOTAL, CKMB, CKMBINDEX, TROPONINI in the last 168 hours. BNP (last 3 results) No results for input(s): PROBNP in the last 8760 hours. HbA1C: No results for input(s): HGBA1C in the last 72 hours. CBG: No results for input(s): GLUCAP in the last 168 hours. Lipid Profile: No results for input(s): CHOL, HDL, LDLCALC, TRIG, CHOLHDL, LDLDIRECT in the last 72 hours. Thyroid Function Tests: No results for input(s): TSH, T4TOTAL, FREET4, T3FREE, THYROIDAB in the last 72 hours. Anemia Panel: Recent Labs    12/25/2019 0949 12/13/19 0148  FERRITIN 282 257   Sepsis Labs: Recent Labs  Lab 2019/12/25 4315 12-25-2019 0923 12-25-2019 0949  PROCALCITON  --   --  6.04  LATICACIDVEN 1.0 0.8  --     Recent Results (from the past 240 hour(s))  Culture, blood (routine x 2)     Status: None (Preliminary result)   Collection  Time: 12-25-19  6:34 AM   Specimen: BLOOD RIGHT ARM  Result Value Ref Range Status   Specimen Description BLOOD RIGHT ARM  Final   Special Requests   Final    BOTTLES DRAWN AEROBIC AND ANAEROBIC Blood Culture adequate volume   Culture   Final    NO GROWTH < 24 HOURS Performed at [facility], [address] [place], [place] [address]    Report Status PENDING  Incomplete  Culture, blood (routine x 2)     Status: None (Preliminary result)   Collection Time: 2019/12/25  6:43 AM   Specimen: BLOOD RIGHT ARM  Result Value Ref Range Status   Specimen Description BLOOD RIGHT ARM  Final   Special Requests   Final    BOTTLES DRAWN AEROBIC AND ANAEROBIC Blood Culture adequate volume   Culture   Final    NO GROWTH <  24 HOURS Performed at [facility], [address] [place], [place] [address]    Report Status PENDING  Incomplete         Radiology Studies: DG CHEST PORT 1 VIEW  Result Date: 12/13/2019 CLINICAL DATA:  Pneumonia. EXAM: PORTABLE CHEST 1 VIEW COMPARISON:  11/19/2019 FINDINGS: 0922 hours. Bilateral patchy ill-defined airspace disease noted in the lungs appears mildly progressive in the interval, notably at the left base and right infrahilar region. Cardiopericardial silhouette is upper limits of normal for size. The visualized bony structures of the thorax are intact. Telemetry leads overlie the chest. IMPRESSION: Interval progression of widespread bilateral ill-defined patchy airspace disease consistent with pneumonia. Electronically Signed   By: Kennith CenterEric  Mansell M.D.   On: 12/13/2019 09:39        Scheduled Meds: . aspirin EC  81 mg Oral Daily  . DULoxetine  30 mg Oral BID  . enoxaparin (LOVENOX) injection  40 mg Subcutaneous Q24H  . feeding supplement (ENSURE ENLIVE)  237 mL Oral TID BM  . levothyroxine  150 mcg Oral Daily  . methylPREDNISolone (SOLU-MEDROL) injection  0.5 mg/kg Intravenous Q12H  . phenytoin  100 mg Oral BID  . primidone  250 mg Oral BID   Continuous  Infusions: . remdesivir 100 mg in NS 100 mL 100 mg (12/13/19 0919)     LOS: 2 days    Time spent: 30 minutes    Dorcas CarrowKuber Meric Joye, MD [facility] Pager [phone]

## 2019-12-13 NOTE — Plan of Care (Signed)
  Problem: Education: Goal: Knowledge of risk factors and measures for prevention of condition will improve Outcome: Progressing   Problem: Coping: Goal: Psychosocial and spiritual needs will be supported Outcome: Progressing   Problem: Respiratory: Goal: Will maintain a patent airway Outcome: Progressing Goal: Complications related to the disease process, condition or treatment will be avoided or minimized Outcome: Progressing   

## 2019-12-13 NOTE — Progress Notes (Signed)
Rapid Response Event Note  Overview: Rapid response nurse paged to come to room for unreadable Sp02 and increasing respirations.    Initial Focused Assessment: Patient tachypneic (RR 30) and complaining of mild shortness of breath. Alert and oriented on 15L NRB + 15L HFNC. MD came to room. Confirmed code status with bedside staff.     Interventions: No escalation of care at this time.   Plan of Care (if not transferred): Diurese patient. 40mg  lasix IV one time.   Bedside staff - please call ICU CN/RRRN if you need any further assistance [phone]    Elspeth Cho

## 2019-12-13 NOTE — Progress Notes (Signed)
Patient asked RN to give her a bath. RN gathered the supplies and wen came to the patient's room, patient is tachypneic  and   Started dessating. RN called a colleague for help and patient became hypoxic. [facility] response called and MD notified. Order was given for lasix IV. Patient is also febrile, 101.1 axillary and Tylenol is given to patient. Patient temperature is 98.9 after revaluation. Patient is on high flo and nonrebreathing at this time. Will continue to monitor patient.

## 2019-12-14 ENCOUNTER — Inpatient Hospital Stay (HOSPITAL_COMMUNITY): Payer: Medicare Other

## 2019-12-14 LAB — COMPREHENSIVE METABOLIC PANEL
ALT: 22 U/L (ref 0–44)
AST: 29 U/L (ref 15–41)
Albumin: 2.3 g/dL — ABNORMAL LOW (ref 3.5–5.0)
Alkaline Phosphatase: 69 U/L (ref 38–126)
Anion gap: 12 (ref 5–15)
BUN: 23 mg/dL (ref 8–23)
CO2: 27 mmol/L (ref 22–32)
Calcium: 8.6 mg/dL — ABNORMAL LOW (ref 8.9–10.3)
Chloride: 102 mmol/L (ref 98–111)
Creatinine, Ser: 0.63 mg/dL (ref 0.44–1.00)
GFR calc Af Amer: >60 mL/min (ref >60)
GFR calc non Af Amer: >60 mL/min (ref >60)
Glucose, Bld: 118 mg/dL — ABNORMAL HIGH (ref 70–99)
Potassium: 3.9 mmol/L (ref 3.5–5.1)
Sodium: 141 mmol/L (ref 135–145)
Total Bilirubin: 0.6 mg/dL (ref 0.3–1.2)
Total Protein: 6.3 g/dL — ABNORMAL LOW (ref 6.5–8.1)

## 2019-12-14 LAB — CBC WITH DIFFERENTIAL/PLATELET
Abs Immature Granulocytes: 0.1 10*3/uL — ABNORMAL HIGH (ref 0.00–0.07)
Basophils Absolute: 0 10*3/uL (ref 0.0–0.1)
Basophils Relative: 0 %
Eosinophils Absolute: 0 10*3/uL (ref 0.0–0.5)
Eosinophils Relative: 0 %
HCT: 36.5 % (ref 36.0–46.0)
Hemoglobin: 11.6 g/dL — ABNORMAL LOW (ref 12.0–15.0)
Immature Granulocytes: 1 %
Lymphocytes Relative: 10 %
Lymphs Abs: 1.1 10*3/uL (ref 0.7–4.0)
MCH: 30.3 pg (ref 26.0–34.0)
MCHC: 31.8 g/dL (ref 30.0–36.0)
MCV: 95.3 fL (ref 80.0–100.0)
Monocytes Absolute: 0.5 10*3/uL (ref 0.1–1.0)
Monocytes Relative: 4 %
Neutro Abs: 9.6 10*3/uL — ABNORMAL HIGH (ref 1.7–7.7)
Neutrophils Relative %: 85 %
Platelets: 332 10*3/uL (ref 150–400)
RBC: 3.83 MIL/uL — ABNORMAL LOW (ref 3.87–5.11)
RDW: 14.4 % (ref 11.5–15.5)
WBC: 11.3 10*3/uL — ABNORMAL HIGH (ref 4.0–10.5)
nRBC: 0 % (ref 0.0–0.2)

## 2019-12-14 LAB — FERRITIN: Ferritin: 193 ng/mL (ref 11–307)

## 2019-12-14 LAB — PROCALCITONIN: Procalcitonin: 5.38 ng/mL

## 2019-12-14 LAB — D-DIMER, QUANTITATIVE: D-Dimer, Quant: 3.04 ug/mL-FEU — ABNORMAL HIGH (ref 0.00–0.50)

## 2019-12-14 LAB — C-REACTIVE PROTEIN: CRP: 36.7 mg/dL — ABNORMAL HIGH (ref <1.0)

## 2019-12-14 MED ORDER — PRIMIDONE 250 MG PO TABS
250.0000 mg | ORAL_TABLET | Freq: Two times a day (BID) | ORAL | Status: DC
  Administered 2019-12-14 – 2019-12-15 (×3): 250 mg via ORAL
  Filled 2019-12-14 (×4): qty 1

## 2019-12-14 MED ORDER — FUROSEMIDE 10 MG/ML IJ SOLN
40.0000 mg | Freq: Once | INTRAMUSCULAR | Status: AC
  Administered 2019-12-14: 10:00:00 40 mg via INTRAVENOUS
  Filled 2019-12-14: qty 4

## 2019-12-14 MED ORDER — PRIMIDONE 250 MG PO TABS
250.0000 mg | ORAL_TABLET | Freq: Two times a day (BID) | ORAL | Status: DC
  Filled 2019-12-14: qty 1

## 2019-12-14 MED ORDER — SODIUM CHLORIDE 0.9% IV SOLUTION: Freq: Once | INTRAVENOUS | Status: AC

## 2019-12-14 NOTE — Evaluation (Signed)
Occupational Therapy Evaluation Patient Details Name: Kari Padilla MRN: [id number] DOB: Jun 05, 1946 Today's Date: 12/14/2019    History of Present Illness 73 yo female presenting to ED with cough and shortness of breath; diagnosed with COVID-19 on 12/21. PMH including developmental delay, seizure disorder, hypothyroidism and depression.   Clinical Impression   Per pt report, she was living with her sister-in-law and performed BADLs and used RW for functional mobility PTA; unsure of reliability due to baseline cognition. Upon arrival, pt supine in bed with urinary incontinence and SpO2 fluctuating between 90s-70s on 15L via HFNC and NRB.  Pt participate in bathing at bed level requiring Max A +2 for bathing and Min A for rolling in bed. Once repositioned to upright posture with bed in chair position, pt performing hair hygiene and brushing her hair with Min A. As session progressed, pt with increased engagement and noting SpO2 maintained  >86% on 15L via HFNC during grooming. RN present throughout. RR between 20-40s and HR 90-120s. Pt would benefit from further acute OT to facilitate safe dc. Recommend dc to SNF for further OT to optimize safety, independence with ADLs, and return to PLOF.      Follow Up Recommendations  SNF;Supervision/Assistance - 24 hour ; Pending home support and progress, may benefit from home environment with [facility]   Equipment Recommendations  Other (comment)(Defer to next venue)    Recommendations for Other Services PT consult     Precautions / Restrictions Precautions Precautions: Fall(Watch vitals. 15L via HFNC and NRB) Restrictions Weight Bearing Restrictions: No      Mobility Bed Mobility Overal bed mobility: Needs Assistance Bed Mobility: Rolling Rolling: Min assist;+2 for safety/equipment         General bed mobility comments: Min A for rolling left and right. Requiring cues for sequencing and to reach towards bed rails.   Transfers                  General transfer comment: Defered due to vitals    Balance                                           ADL either performed or assessed with clinical judgement   ADL Overall ADL's : Needs assistance/impaired Eating/Feeding: Set up;Bed level Eating/Feeding Details (indicate cue type and reason): RN reports she was present to assist with feeding this morning Grooming: Brushing hair;Minimal assistance;Bed level Grooming Details (indicate cue type and reason): Pt brushing her hair once upright in chiar position in bed. Min A for reaching to brush back of hair Upper Body Bathing: Maximal assistance;Bed level;Moderate assistance   Lower Body Bathing: Maximal assistance;Bed level;+2 for physical assistance Lower Body Bathing Details (indicate cue type and reason): Max A to clean up after urinary incontience in bed Upper Body Dressing : Minimal assistance;Bed level(upright in chair position) Upper Body Dressing Details (indicate cue type and reason): Min A for donning new gown Lower Body Dressing: Maximal assistance                 General ADL Comments: Due to fever and vitals, focused session at bed level for bathing after urinary incontience in bed. Pt participating in rolling for bathing and performing hair hygiene.     Vision         Perception     Praxis      Pertinent Vitals/Pain Pain Assessment: Faces  Faces Pain Scale: No hurt Pain Intervention(s): Monitored during session     Hand Dominance Right   Extremity/Trunk Assessment Upper Extremity Assessment Upper Extremity Assessment: Generalized weakness   Lower Extremity Assessment Lower Extremity Assessment: Defer to PT evaluation       Communication Communication Communication: HOH   Cognition Arousal/Alertness: Awake/alert Behavior During Therapy: WFL for tasks assessed/performed(Slight flat affect at first) Overall Cognitive Status: Impaired/Different from baseline Area of  Impairment: Orientation;Following commands;Problem solving                 Orientation Level: Disoriented to;Time;Situation(Stating it is 2021. Requiring orientation to why in hospital)     Following Commands: Follows one step commands with increased time     Problem Solving: Slow processing;Requires verbal cues General Comments: Pt following simple commands. Pt inititally presenting with flat affect and very fatigued. Reporting that she is in the hospital because she is sick; oriented to diagnosis of COVID. Pt also stating it is year 2021. Pt partcipating in bed mobility and grooming tasks at bed level. Per chart, developmental delay at baseline.   General Comments  HR 90-120s. RR 10-40s. SpO2 flucuating between 90s-70s on 15L via NRB and HFNC. At end of session, pt performing grooming takss (washing her hair and brushing her hair) while on 15L via HFNC only and maintaining >86%. RN present throughout    Exercises     Shoulder Instructions      Home Living Family/patient expects to be discharged to:: Private residence Living Arrangements: Other (Comment)(Sister-in-law) Available Help at Discharge: Family;Available PRN/intermittently(Pt stating sister in law works but unsure if able to takeoff) Type of Home: House       Home Layout: One level               Home Equipment: Environmental consultantWalker - 2 wheels          Prior Functioning/Environment Level of Independence: Needs assistance  Gait / Transfers Assistance Needed: Pt reports she uses a RW for mobility ADL's / Homemaking Assistance Needed: Pt reports she performs BADLs and sister-in-law does IADLs            OT Problem List: Decreased strength;Decreased activity tolerance;Decreased range of motion;Impaired balance (sitting and/or standing);Decreased knowledge of precautions;Decreased knowledge of use of DME or AE;Cardiopulmonary status limiting activity      OT Treatment/Interventions: Self-care/ADL training;Therapeutic  exercise;Energy conservation;DME and/or AE instruction;Therapeutic activities;Patient/family education    OT Goals(Current goals can be found in the care plan section) Acute Rehab OT Goals Patient Stated Goal: Unstated OT Goal Formulation: With patient Time For Goal Achievement: 12/28/19 Potential to Achieve Goals: Good  OT Frequency: Min 3X/week   Barriers to D/C:            Co-evaluation              AM-PAC OT "6 Clicks" Daily Activity     Outcome Measure Help from another person eating meals?: None Help from another person taking care of personal grooming?: A Little Help from another person toileting, which includes using toliet, bedpan, or urinal?: A Lot Help from another person bathing (including washing, rinsing, drying)?: A Lot Help from another person to put on and taking off regular upper body clothing?: A Little Help from another person to put on and taking off regular lower body clothing?: A Lot 6 Click Score: 16   End of Session Equipment Utilized During Treatment: Oxygen(15L via HFNC and NRB) Nurse Communication: Mobility status  Activity Tolerance: Patient limited by fatigue  Patient left: in bed;with call bell/phone within reach;with bed alarm set;with nursing/sitter in room  OT Visit Diagnosis: Unsteadiness on feet (R26.81);Other abnormalities of gait and mobility (R26.89);Muscle weakness (generalized) (M62.81)                Time: 3244-0102 OT Time Calculation (min): 40 min Charges:  OT General Charges $OT Visit: 1 Visit OT Evaluation $OT Eval Moderate Complexity: 1 Mod OT Treatments $Self Care/Home Management : 23-37 mins  Naleyah Ohlinger MSOT, OTR/L Acute Rehab Pager: [phone] Office: [phone]  Theodoro Grist Odysseus Cada 12/14/2019, 10:50 AM

## 2019-12-14 NOTE — Progress Notes (Signed)
RN arrived in patients room and patient was c/o SOB, tachypneic, O2 saturation 79%. Patient had removed NRB but had HFNC in place at 15L. Replaced NRB mask @ 15L. Patient repositioned and pulled up in bed, performing deep breathing exercises w/RN. Patient O2 saturation returned to 92% and patient breathing was non-labored but shallow. Patient educated on need of two forms of oxygen; patient has developmental delay issues. Will continue to monitor.

## 2019-12-14 NOTE — Progress Notes (Signed)
PT Cancellation Note  Patient Details Name: Kari Padilla MRN: [id number] DOB: 10-02-1946   Cancelled Treatment:    Reason Eval/Treat Not Completed: Other (comment)OT saw patient in  AM, requires  A lot of O2 will evaluate in AM.    Prashant Glosser, [place] Pager [phone] Office [phone]  12/14/2019, 3:14 PM

## 2019-12-14 NOTE — Progress Notes (Signed)
PROGRESS NOTE    Kari Padilla  TKW:[id number] DOB: [birth year] DOA: 12-17-19 PCP: Patient, No Pcp Per    Brief Narrative:  73 year old female with history of developmental delay, seizure disorder, hypothyroidism and depression brought to emergency room with about 1 week of symptoms and diagnosed with COVID-19 on 12/21.  Patient was having increasing shortness of breath and cough so brought to ER.  In the emergency room 76% on room air, placed on nonrebreather mask and admitted.  Transfer to [facility]. 12/28: continue to require high oxygen.  Assessment & Plan:   Active Problems:   Seizures (HCC)   Acute respiratory failure with hypoxia (HCC)   Pneumonia due to COVID-19 virus   Hypothyroidism   Depression   DNR (do not resuscitate)  Pneumonia due to COVID-19 virus with acute hypoxemic respiratory failure: Continue to monitor due to significant symptoms, remains on very high flow and significant amount oxygen. chest physiotherapy, incentive spirometry, deep breathing exercises, sputum induction, mucolytic's and bronchodilators.  Try to keep prone as much as possible. Supplemental oxygen to keep saturations more than 85%. Covid directed therapy with , steroids, on Solu-Medrol remdesivir, day 4/5 actemra, not given.  Persistently elevated procalcitonin. antibiotics, started on Rocephin azithromycin with persistent elevated procalcitonin. Due to severity of symptoms, patient will need daily inflammatory markers, chest x-rays, liver function test to monitor and direct COVID-19 therapies. Repeat chest x-ray today shows worsening pneumonia. Intermittent doses of Lasix. Proning as much as possible. Convalescent plasma: Experimental.  patient with worsening hypoxemia.  Discussed with patient's healthcare power of attorney, Kari Padilla that this is one of the experimental modality of COVID 19 treatment that involves giving plasma from patients who have recovered from  infection.  This is one of the blood product. This may or may not benefit in the current state of disease. in some instatnces, it may improve survival and recovery.  Side effects include reactions from plasma proteins that are foreign bodies and very rare chances of infections that are treatable.  Healthcare power of attorney consented Transfusion.  History of seizure disorder: No evidence of new seizure.  Remains on phenytoin.  Hypothyroidism: On Synthroid.  Continue.  Depression: On Cymbalta that she will continue.   DVT prophylaxis: Lovenox Code Status: DNR Family Communication: Sister [name] on the phone. Disposition Plan: Remains critically sick in the hospital.   Consultants:   None  Procedures:   None  Antimicrobials:  Anti-infectives (From admission, onward)   Start     Dose/Rate Route Frequency Ordered Stop   12/13/19 1600  azithromycin (ZITHROMAX) 500 mg in sodium chloride 0.9 % 250 mL IVPB     500 mg 250 mL/hr over 60 Minutes Intravenous Every 24 hours 12/13/19 1305 12/18/19 1559   12/13/19 1400  cefTRIAXone (ROCEPHIN) 2 g in sodium chloride 0.9 % 100 mL IVPB     2 g 200 mL/hr over 30 Minutes Intravenous Every 24 hours 12/13/19 1305 12/18/19 1359   12/12/19 1000  remdesivir 100 mg in sodium chloride 0.9 % 100 mL IVPB     100 mg 200 mL/hr over 30 Minutes Intravenous Daily 2019/12/17 0923 12/16/19 0959   12/17/2019 1000  remdesivir 200 mg in sodium chloride 0.9% 250 mL IVPB     200 mg 580 mL/hr over 30 Minutes Intravenous Once Dec 17, 2019 9242 2019/12/17 1736         Subjective: Seen and examined.  Persistently remains on high flow oxygen as well as nonrebreather with 100% oxygen.  Poor historian.  Still has temperature.  Objective: Vitals:   12/14/19 0357 12/14/19 0500 12/14/19 0748 12/14/19 1000  BP: (!) 147/74  (!) 165/84   Pulse: 94  (!) 105   Resp: (!) 24  (!) 38   Temp: 98.7 F (37.1 C)  (!) 100.7 F (38.2 C) 99.4 F (37.4 C)  TempSrc: Axillary  Axillary  Axillary  SpO2: (!) 89%  (!) 88%   Weight:  80.5 kg    Height:        Intake/Output Summary (Last 24 hours) at 12/14/2019 1038 Last data filed at 12/14/2019 0600 Gross per 24 hour  Intake 810 ml  Output 900 ml  Net -90 ml   Filed Weights   12/12/19 0500 12/13/19 0500 12/14/19 0500  Weight: 80.1 kg 83 kg 80.5 kg    Examination:  Physical Exam  Constitutional: She appears distressed.  Sick looking, in moderate respiratory distress, 100% nonrebreather  Eyes: Pupils are equal, round, and reactive to light.  Cardiovascular: Normal rate and regular rhythm.  Pulmonary/Chest:  Moderate respiratory distress.  Bilateral poor air entry.  Abdominal: Bowel sounds are normal.  Musculoskeletal:     Cervical back: Normal range of motion and neck supple.  Neurological: She is alert.  Oriented x2-3.      Data Reviewed: I have personally reviewed following labs and imaging studies  CBC: Recent Labs  Lab 12/16/2019 0607 11/23/2019 0949 12/13/19 0148 12/14/19 0414  WBC 8.2 8.8 9.2 11.3*  NEUTROABS 7.3  --  7.9* 9.6*  HGB 11.7* 11.2* 11.6* 11.6*  HCT 36.8 35.0* 37.2 36.5  MCV 95.1 94.9 95.9 95.3  PLT 236 222 302 332   Basic Metabolic Panel: Recent Labs  Lab 11/17/2019 0607 12/07/2019 0949 12/13/19 0148 12/14/19 0414  NA 131*  --  137 141  K 4.2  --  4.6 3.9  CL 99  --  95* 102  CO2 23  --  28 27  GLUCOSE 156*  --  126* 118*  BUN 15  --  18 23  CREATININE 0.75 0.70 0.53 0.63  CALCIUM 8.0*  --  7.8* 8.6*   GFR: Estimated Creatinine Clearance: 58 mL/min (by C-G formula based on SCr of 0.63 mg/dL). Liver Function Tests: Recent Labs  Lab 12/09/2019 0607 12/13/19 0148 12/14/19 0414  AST 37 33 29  ALT 26 22 22   ALKPHOS 64 62 69  BILITOT 0.4 0.6 0.6  PROT 6.6 6.7 6.3*  ALBUMIN 2.8* 2.5* 2.3*   No results for input(s): LIPASE, AMYLASE in the last 168 hours. No results for input(s): AMMONIA in the last 168 hours. Coagulation Profile: No results for input(s): INR, PROTIME  in the last 168 hours. Cardiac Enzymes: No results for input(s): CKTOTAL, CKMB, CKMBINDEX, TROPONINI in the last 168 hours. BNP (last 3 results) No results for input(s): PROBNP in the last 8760 hours. HbA1C: No results for input(s): HGBA1C in the last 72 hours. CBG: No results for input(s): GLUCAP in the last 168 hours. Lipid Profile: No results for input(s): CHOL, HDL, LDLCALC, TRIG, CHOLHDL, LDLDIRECT in the last 72 hours. Thyroid Function Tests: No results for input(s): TSH, T4TOTAL, FREET4, T3FREE, THYROIDAB in the last 72 hours. Anemia Panel: Recent Labs    12/13/19 0148 12/14/19 0414  FERRITIN 257 193   Sepsis Labs: Recent Labs  Lab 11/22/2019 0643 11/25/2019 0923 12/04/2019 0949 12/13/19 0148 12/14/19 0414  PROCALCITON  --   --  6.04 5.79 5.38  LATICACIDVEN 1.0 0.8  --   --   --  Recent Results (from the past 240 hour(s))  Culture, blood (routine x 2)     Status: None (Preliminary result)   Collection Time: December 27, 2019  6:34 AM   Specimen: BLOOD RIGHT ARM  Result Value Ref Range Status   Specimen Description BLOOD RIGHT ARM  Final   Special Requests   Final    BOTTLES DRAWN AEROBIC AND ANAEROBIC Blood Culture adequate volume   Culture   Final    NO GROWTH 3 DAYS Performed at [facility], [address], [place], [place] [address]    Report Status PENDING  Incomplete  Culture, blood (routine x 2)     Status: None (Preliminary result)   Collection Time: 12-27-19  6:43 AM   Specimen: BLOOD RIGHT ARM  Result Value Ref Range Status   Specimen Description BLOOD RIGHT ARM  Final   Special Requests   Final    BOTTLES DRAWN AEROBIC AND ANAEROBIC Blood Culture adequate volume   Culture   Final    NO GROWTH 3 DAYS Performed at [facility], [address], [place], [place] [address]    Report Status PENDING  Incomplete         Radiology Studies: DG CHEST PORT 1 VIEW  Result Date: 12/14/2019 CLINICAL DATA:  COVID-19 pneumonia EXAM: PORTABLE CHEST 1 VIEW  COMPARISON:  Chest radiograph from one day prior. FINDINGS: Stable cardiomediastinal silhouette with normal heart size. No pneumothorax. No pleural effusion. Severe patchy opacities throughout both lungs appears slightly worsened. IMPRESSION: Severe patchy opacities throughout both lungs, slightly worsened, compatible with COVID-19 pneumonia. Electronically Signed   By: Ilona Sorrel M.D.   On: 12/14/2019 08:29   DG CHEST PORT 1 VIEW  Result Date: 12/13/2019 CLINICAL DATA:  Pneumonia. EXAM: PORTABLE CHEST 1 VIEW COMPARISON:  Dec 27, 2019 FINDINGS: 0922 hours. Bilateral patchy ill-defined airspace disease noted in the lungs appears mildly progressive in the interval, notably at the left base and right infrahilar region. Cardiopericardial silhouette is upper limits of normal for size. The visualized bony structures of the thorax are intact. Telemetry leads overlie the chest. IMPRESSION: Interval progression of widespread bilateral ill-defined patchy airspace disease consistent with pneumonia. Electronically Signed   By: Misty Stanley M.D.   On: 12/13/2019 09:39        Scheduled Meds: . sodium chloride   Intravenous Once  . aspirin EC  81 mg Oral Daily  . DULoxetine  30 mg Oral BID  . enoxaparin (LOVENOX) injection  40 mg Subcutaneous Q24H  . feeding supplement (ENSURE ENLIVE)  237 mL Oral TID BM  . levothyroxine  150 mcg Oral Daily  . methylPREDNISolone (SOLU-MEDROL) injection  0.5 mg/kg Intravenous Q12H  . phenytoin  100 mg Oral BID  . primidone  250 mg Oral BID   Continuous Infusions: . azithromycin 500 mg (12/13/19 1627)  . cefTRIAXone (ROCEPHIN)  IV 2 g (12/13/19 1430)  . remdesivir 100 mg in NS 100 mL 100 mg (12/14/19 1019)     LOS: 3 days    Time spent: 30 minutes    Barb Merino, MD [facility] Pager [phone]

## 2019-12-15 DIAGNOSIS — U071 COVID-19: Secondary | ICD-10-CM | POA: Diagnosis present

## 2019-12-15 DIAGNOSIS — J80 Acute respiratory distress syndrome: Secondary | ICD-10-CM | POA: Diagnosis present

## 2019-12-15 LAB — COMPREHENSIVE METABOLIC PANEL
ALT: 26 U/L (ref 0–44)
AST: 48 U/L — ABNORMAL HIGH (ref 15–41)
Albumin: 2.5 g/dL — ABNORMAL LOW (ref 3.5–5.0)
Alkaline Phosphatase: 93 U/L (ref 38–126)
Anion gap: 13 (ref 5–15)
BUN: 27 mg/dL — ABNORMAL HIGH (ref 8–23)
CO2: 28 mmol/L (ref 22–32)
Calcium: 8.5 mg/dL — ABNORMAL LOW (ref 8.9–10.3)
Chloride: 98 mmol/L (ref 98–111)
Creatinine, Ser: 0.82 mg/dL (ref 0.44–1.00)
GFR calc Af Amer: >60 mL/min (ref >60)
GFR calc non Af Amer: >60 mL/min (ref >60)
Glucose, Bld: 153 mg/dL — ABNORMAL HIGH (ref 70–99)
Potassium: 4.3 mmol/L (ref 3.5–5.1)
Sodium: 139 mmol/L (ref 135–145)
Total Bilirubin: 0.8 mg/dL (ref 0.3–1.2)
Total Protein: 6.5 g/dL (ref 6.5–8.1)

## 2019-12-15 LAB — CBC WITH DIFFERENTIAL/PLATELET
Abs Immature Granulocytes: 0.15 10*3/uL — ABNORMAL HIGH (ref 0.00–0.07)
Basophils Absolute: 0 10*3/uL (ref 0.0–0.1)
Basophils Relative: 0 %
Eosinophils Absolute: 0 10*3/uL (ref 0.0–0.5)
Eosinophils Relative: 0 %
HCT: 36.3 % (ref 36.0–46.0)
Hemoglobin: 11.5 g/dL — ABNORMAL LOW (ref 12.0–15.0)
Immature Granulocytes: 1 %
Lymphocytes Relative: 6 %
Lymphs Abs: 0.8 10*3/uL (ref 0.7–4.0)
MCH: 29.6 pg (ref 26.0–34.0)
MCHC: 31.7 g/dL (ref 30.0–36.0)
MCV: 93.3 fL (ref 80.0–100.0)
Monocytes Absolute: 0.4 10*3/uL (ref 0.1–1.0)
Monocytes Relative: 3 %
Neutro Abs: 12.3 10*3/uL — ABNORMAL HIGH (ref 1.7–7.7)
Neutrophils Relative %: 90 %
Platelets: 248 10*3/uL (ref 150–400)
RBC: 3.89 MIL/uL (ref 3.87–5.11)
RDW: 14.4 % (ref 11.5–15.5)
WBC: 13.7 10*3/uL — ABNORMAL HIGH (ref 4.0–10.5)
nRBC: 0 % (ref 0.0–0.2)

## 2019-12-15 LAB — FERRITIN: Ferritin: 230 ng/mL (ref 11–307)

## 2019-12-15 LAB — TYPE AND SCREEN
ABO/RH(D): A NEG
Antibody Screen: NEGATIVE

## 2019-12-15 LAB — BPAM FFP
Blood Product Expiration Date: 202012292055
ISSUE DATE / TIME: 202012282200
Unit Type and Rh: 600

## 2019-12-15 LAB — PROCALCITONIN: Procalcitonin: 4.1 ng/mL

## 2019-12-15 LAB — PREPARE FRESH FROZEN PLASMA

## 2019-12-15 LAB — C-REACTIVE PROTEIN: CRP: 36.5 mg/dL — ABNORMAL HIGH (ref <1.0)

## 2019-12-15 LAB — D-DIMER, QUANTITATIVE: D-Dimer, Quant: >20 ug/mL-FEU — ABNORMAL HIGH (ref 0.00–0.50)

## 2019-12-15 MED ORDER — MORPHINE SULFATE (PF) 2 MG/ML IV SOLN
2.0000 mg | Freq: Once | INTRAVENOUS | Status: AC
  Administered 2019-12-15: 2 mg via INTRAVENOUS
  Filled 2019-12-15: qty 1

## 2019-12-15 MED ORDER — ALBUTEROL SULFATE HFA 108 (90 BASE) MCG/ACT IN AERS
2.0000 | INHALATION_SPRAY | RESPIRATORY_TRACT | Status: DC | PRN
  Filled 2019-12-15: qty 6.7

## 2019-12-15 MED ORDER — ENOXAPARIN SODIUM 80 MG/0.8ML ~~LOC~~ SOLN: 80.0000 mg | Freq: Two times a day (BID) | SUBCUTANEOUS | Status: DC

## 2019-12-15 MED ORDER — ENOXAPARIN SODIUM 40 MG/0.4ML ~~LOC~~ SOLN
40.0000 mg | Freq: Once | SUBCUTANEOUS | Status: AC
  Administered 2019-12-15: 40 mg via SUBCUTANEOUS
  Filled 2019-12-15: qty 0.4

## 2019-12-15 MED ORDER — FUROSEMIDE 10 MG/ML IJ SOLN
40.0000 mg | Freq: Once | INTRAMUSCULAR | Status: AC
  Administered 2019-12-15: 40 mg via INTRAVENOUS

## 2019-12-15 MED ORDER — FUROSEMIDE 10 MG/ML IJ SOLN: 40.0000 mg | Freq: Once | INTRAMUSCULAR | Status: DC

## 2019-12-16 LAB — CULTURE, BLOOD (ROUTINE X 2)
Culture: NO GROWTH
Culture: NO GROWTH
Special Requests: ADEQUATE
Special Requests: ADEQUATE

## 2019-12-18 NOTE — Progress Notes (Signed)
Spoke with POA Judson Roch (ph: [phone]) to update with patient status and transfer to room 175.

## 2019-12-18 NOTE — Progress Notes (Signed)
RRT nurse made aware of pt's increasing MEW scores. Will try to round on pt before end of shift, if not morning ICU nurse will come to round

## 2019-12-18 NOTE — Progress Notes (Addendum)
PROGRESS NOTE    Kari Padilla  HFW:[id number] DOB: [birth year] DOA: 2020/01/09 PCP: Patient, No Pcp Per    Brief Narrative:  74 year old female with history of developmental delay, seizure disorder, hypothyroidism and depression brought to emergency room with about 1 week of symptoms and diagnosed with COVID-19 on 12/21.  Patient was having increasing shortness of breath and cough so brought to ER.  In the emergency room 76% on room air, placed on nonrebreather mask and admitted.  Transfer to [facility], [place]. 12/28: continue to require high oxygen. 12/29: Lethargic and unable to maintain saturations, needing heated high flow.  Confirmed DNR with power of attorney for healthcare.  Assessment & Plan:   Active Problems:   Seizures (HCC)   Acute respiratory failure with hypoxia (HCC)   Pneumonia due to COVID-19 virus   Hypothyroidism   Depression   DNR (do not resuscitate)  Pneumonia due to COVID-19 virus with acute hypoxemic respiratory failure: Continue to monitor due to significant symptoms, remains on very high flow and significant amount oxygen. chest physiotherapy, incentive spirometry, deep breathing exercises, sputum induction, mucolytic's and bronchodilators as much she can do.  Try to keep prone as much as possible. Supplemental oxygen to keep saturations more than 85%. Covid directed therapy with , steroids, on Solu-Medrol remdesivir, day 5/5 actemra, not given.  Persistently elevated procalcitonin. antibiotics, started on Rocephin azithromycin with persistent elevated procalcitonin.  Cultures negative so far. Due to severity of symptoms, patient will need daily inflammatory markers, chest x-rays, liver function test to monitor and direct COVID-19 therapies. Repeat chest x-ray today shows worsening pneumonia. Intermittent doses of Lasix. Proning as much as possible. D-dimer is very high, more than 20.  Patient is not stable enough to go for CTA.  Will start patient  on therapeutic Lovenox.  Convalescent plasma: Given 12/06/2019.  Experimental.  patient with worsening hypoxemia.  Discussed with patient's healthcare power of attorney, [name] that this is one of the experimental modality of COVID 19 treatment that involves giving plasma from patients who have recovered from infection.  This is one of the blood product. This may or may not benefit in the current state of disease. in some instatnces, it may improve survival and recovery.  Side effects include reactions from plasma proteins that are foreign bodies and very rare chances of infections that are treatable.  Healthcare power of attorney consented Transfusion.  History of seizure disorder: No evidence of new seizure.  Remains on phenytoin.  Hypothyroidism: On Synthroid.  Continue.  Depression: On Cymbalta that she will continue.  Advance care planning: Discussed with patient's Sister in law, [name] at length.  She is aware about patient's poor prognosis.  Confirmed DNR/DNI. We will continue with aggressive therapies other than mechanical ventilation and cardiac resuscitation.  If she reaches to the point of severe distress, we will contact Ms. [name] and decide about comfort care.  DVT prophylaxis: Lovenox Code Status: DNR/DNI Family Communication: Sister [name] on the phone updated multiple times. Disposition Plan: Remains critically sick in the hospital.   Consultants:   None  Procedures:   None  Antimicrobials:  Anti-infectives (From admission, onward)   Start     Dose/Rate Route Frequency Ordered Stop   12/13/19 1600  azithromycin (ZITHROMAX) 500 mg in sodium chloride 0.9 % 250 mL IVPB     500 mg 250 mL/hr over 60 Minutes Intravenous Every 24 hours 12/13/19 1305 12/18/19 1559   12/13/19 1400  cefTRIAXone (ROCEPHIN) 2 g in sodium chloride 0.9 %  100 mL IVPB     2 g 200 mL/hr over 30 Minutes Intravenous Every 24 hours 12/13/19 1305 12/18/19 1359   12/12/19 1000  remdesivir 100 mg in  sodium chloride 0.9 % 100 mL IVPB     100 mg 200 mL/hr over 30 Minutes Intravenous Daily 12/12/2019 0923 December 24, 2019 1209   12/04/2019 1000  remdesivir 200 mg in sodium chloride 0.9% 250 mL IVPB     200 mg 580 mL/hr over 30 Minutes Intravenous Once 12/16/2019 4097 11/17/2019 1736         Subjective: Patient seen and examined.  Last evening she was maintained on nonrebreather and 15 L high flow, however today morning gradually worsened, unable to maintain saturation with altered mental status. Rapid response was called, also discussed with patient's sister-in-law.  Started on heated high flow. Given dose of Lasix, 2 mg of morphine.  Objective: Vitals:   12-24-19 1104 12/24/19 1115 Dec 24, 2019 1200 24-Dec-2019 1248  BP:  (!) 145/67    Pulse:    (!) 107  Resp:    (!) 43  Temp: 98.8 F (37.1 C)     TempSrc: Axillary     SpO2:   (!) 78%   Weight:      Height:        Intake/Output Summary (Last 24 hours) at 12/24/19 1310 Last data filed at 12-24-2019 0300 Gross per 24 hour  Intake 988.75 ml  Output 540 ml  Net 448.75 ml   Filed Weights   12/12/19 0500 12/13/19 0500 12/14/19 0500  Weight: 80.1 kg 83 kg 80.5 kg    Examination:  Physical Exam  Constitutional: She appears distressed.  Sick looking, in moderate respiratory distress,  Unable to maintain adequate saturations on nonrebreather and high flow.  Eyes: Pupils are equal, round, and reactive to light.  Cardiovascular: Normal rate and regular rhythm.  Pulmonary/Chest:  Moderate respiratory distress.  Bilateral poor air entry.  Abdominal: Bowel sounds are normal.  Musculoskeletal:     Cervical back: Normal range of motion and neck supple.  Neurological: She is alert.  But anxious.      Data Reviewed: I have personally reviewed following labs and imaging studies  CBC: Recent Labs  Lab 12/16/2019 0607 12/16/2019 0949 12/13/19 0148 12/14/19 0414 12/24/19 0454  WBC 8.2 8.8 9.2 11.3* 13.7*  NEUTROABS 7.3  --  7.9* 9.6* 12.3*   HGB 11.7* 11.2* 11.6* 11.6* 11.5*  HCT 36.8 35.0* 37.2 36.5 36.3  MCV 95.1 94.9 95.9 95.3 93.3  PLT 236 222 302 332 353   Basic Metabolic Panel: Recent Labs  Lab 12/14/2019 0607 11/18/2019 0949 12/13/19 0148 12/14/19 0414 December 24, 2019 0454  NA 131*  --  137 141 139  K 4.2  --  4.6 3.9 4.3  CL 99  --  95* 102 98  CO2 23  --  28 27 28   GLUCOSE 156*  --  126* 118* 153*  BUN 15  --  18 23 27*  CREATININE 0.75 0.70 0.53 0.63 0.82  CALCIUM 8.0*  --  7.8* 8.6* 8.5*   GFR: Estimated Creatinine Clearance: 56.5 mL/min (by C-G formula based on SCr of 0.82 mg/dL). Liver Function Tests: Recent Labs  Lab 11/25/2019 0607 12/13/19 0148 12/14/19 0414 12/24/19 0454  AST 37 33 29 48*  ALT 26 22 22 26   ALKPHOS 64 62 69 93  BILITOT 0.4 0.6 0.6 0.8  PROT 6.6 6.7 6.3* 6.5  ALBUMIN 2.8* 2.5* 2.3* 2.5*   No results for input(s): LIPASE, AMYLASE  in the last 168 hours. No results for input(s): AMMONIA in the last 168 hours. Coagulation Profile: No results for input(s): INR, PROTIME in the last 168 hours. Cardiac Enzymes: No results for input(s): CKTOTAL, CKMB, CKMBINDEX, TROPONINI in the last 168 hours. BNP (last 3 results) No results for input(s): PROBNP in the last 8760 hours. HbA1C: No results for input(s): HGBA1C in the last 72 hours. CBG: No results for input(s): GLUCAP in the last 168 hours. Lipid Profile: No results for input(s): CHOL, HDL, LDLCALC, TRIG, CHOLHDL, LDLDIRECT in the last 72 hours. Thyroid Function Tests: No results for input(s): TSH, T4TOTAL, FREET4, T3FREE, THYROIDAB in the last 72 hours. Anemia Panel: Recent Labs    12/14/19 0414 08/18/2019 0454  FERRITIN 193 230   Sepsis Labs: Recent Labs  Lab 12/12/2019 0643 11/29/2019 0923 12/01/2019 0949 12/13/19 0148 12/14/19 0414 08/18/2019 0454  PROCALCITON  --   --  6.04 5.79 5.38 4.10  LATICACIDVEN 1.0 0.8  --   --   --   --     Recent Results (from the past 240 hour(s))  Culture, blood (routine x 2)     Status: None  (Preliminary result)   Collection Time: 11/27/2019  6:34 AM   Specimen: BLOOD RIGHT ARM  Result Value Ref Range Status   Specimen Description BLOOD RIGHT ARM  Final   Special Requests   Final    BOTTLES DRAWN AEROBIC AND ANAEROBIC Blood Culture adequate volume   Culture   Final    NO GROWTH 4 DAYS Performed at [facility], [address] [place], [place] [address]    Report Status PENDING  Incomplete  Culture, blood (routine x 2)     Status: None (Preliminary result)   Collection Time: 12/13/2019  6:43 AM   Specimen: BLOOD RIGHT ARM  Result Value Ref Range Status   Specimen Description BLOOD RIGHT ARM  Final   Special Requests   Final    BOTTLES DRAWN AEROBIC AND ANAEROBIC Blood Culture adequate volume   Culture   Final    NO GROWTH 4 DAYS Performed at [facility], [address] [place], [place] [address]    Report Status PENDING  Incomplete         Radiology Studies: DG CHEST PORT 1 VIEW  Result Date: 12/14/2019 CLINICAL DATA:  COVID-19 pneumonia EXAM: PORTABLE CHEST 1 VIEW COMPARISON:  Chest radiograph from one day prior. FINDINGS: Stable cardiomediastinal silhouette with normal heart size. No pneumothorax. No pleural effusion. Severe patchy opacities throughout both lungs appears slightly worsened. IMPRESSION: Severe patchy opacities throughout both lungs, slightly worsened, compatible with COVID-19 pneumonia. Electronically Signed   By: Delbert PhenixJason A Poff M.D.   On: 12/14/2019 08:29        Scheduled Meds: . aspirin EC  81 mg Oral Daily  . DULoxetine  30 mg Oral BID  . enoxaparin (LOVENOX) injection  80 mg Subcutaneous Q12H  . feeding supplement (ENSURE ENLIVE)  237 mL Oral TID BM  . levothyroxine  150 mcg Oral Daily  . methylPREDNISolone (SOLU-MEDROL) injection  0.5 mg/kg Intravenous Q12H  . phenytoin  100 mg Oral BID  . primidone  250 mg Oral BID   Continuous Infusions: . azithromycin 500 mg (12/14/19 1602)  . cefTRIAXone (ROCEPHIN)  IV Stopped (12/14/19 1418)      LOS: 4 days    Time spent: 30 minutes    Dorcas CarrowKuber Karliah Kowalchuk, MD [facility] Pager [phone]

## 2019-12-18 NOTE — Progress Notes (Signed)
Pt kept pulling nonrebreather off, bilateral mitts applied.

## 2019-12-18 NOTE — Progress Notes (Signed)
Pt confused and plays with NRB, sometimes takes it off and desats. Reoriented and mask replaced. Currently MEW score has increased due to increasing HR and BP. Pt has been tachypenic, SOB decreases and increases depending on what she is doing. Charge nurse has been made aware of MEW score changes and page was sent to MD and Dr. Vanita Ingles updated. Q15 vitals being done, but no serious change has occurred with the patient. She is still alert, oriented to self. Wakes spontaneously and to voice.

## 2019-12-18 NOTE — Progress Notes (Signed)
Rapid response. Patient diaphoretic, pale, only respond to pain, O2sat @71 % on 15L HFNC & NRB. Dr. Ree Kida. Blood sugar checked. Patient has wheezing, labored accessory muscle breathing and tachypneic.  Lasix and morphine given per orders. Patient to be transferred to room 175 to be on heated high flow oxygen. Family to be notified.

## 2019-12-18 NOTE — Progress Notes (Signed)
PT Cancellation Note  Patient Details Name: Kari Padilla MRN: [id number] DOB: Dec 06, 1946   Cancelled Treatment:    Reason Eval/Treat Not Completed: Medical issues which prohibited therapy  Per RN, patient not maintaining sats even at rest. Currently charge nurse calling ICU charge nurse with ?transfer.   Arby Barrette, PT Pager [phone]  Rexanne Mano 12/27/2019, 11:06 AM

## 2019-12-18 NOTE — Death Summary Note (Signed)
DEATH SUMMARY   Patient Details  Name: Kari Padilla MRN: [id number] DOB: 23-Feb-1946  Admission/Discharge Information   Admit Date:  December 26, 2019  Date of Death: Date of Death: 12-30-2019  Time of Death: Time of Death: 1540  Length of Stay: 4  Referring Physician: Patient, No Pcp Per   Reason(s) for Hospitalization  Acute respiratory distress syndrome due to COVID-19 virus  Diagnoses  Preliminary cause of death:  Secondary Diagnoses (including complications and co-morbidities):  Principal Problem:   Acute respiratory distress syndrome (ARDS) due to COVID-19 virus [facility]) Active Problems:   Seizures (HCC)   Acute respiratory failure with hypoxia (HCC)   Pneumonia due to COVID-19 virus   Hypothyroidism   Depression   DNR (do not resuscitate)   Brief Hospital Course (including significant findings, care, treatment, and services provided and events leading to death)  ARILYNN Padilla is a 74 y.o. year old female who has history of developmental delay, seizure disorder, hypothyroidism and depression who was brought to the emergency room with about 1 week of symptoms and diagnosed with COVID-19 on 12/21.  She was having increasing shortness of breath and cough.  In the emergency room she was hypoxic, 76% on room air.  She was treated with high flow oxygen and transferred to [facility] where she was treated with IV steroids, remdesivir, IV antibiotics, oxygen support and intermittent diuretics.  While on active treatment, patient had rapidly worsening symptoms with hypoxia, develop acute respiratory distress syndrome.  Patient was actively treated in the hospital, she died while on treatment at 1540 p.m. on 12/30/2019.    Pertinent Labs and Studies  Significant Diagnostic Studies DG CHEST PORT 1 VIEW  Result Date: 12/14/2019 CLINICAL DATA:  COVID-19 pneumonia EXAM: PORTABLE CHEST 1 VIEW COMPARISON:  Chest radiograph from one day prior. FINDINGS: Stable cardiomediastinal  silhouette with normal heart size. No pneumothorax. No pleural effusion. Severe patchy opacities throughout both lungs appears slightly worsened. IMPRESSION: Severe patchy opacities throughout both lungs, slightly worsened, compatible with COVID-19 pneumonia. Electronically Signed   By: Delbert Phenix M.D.   On: 12/14/2019 08:29   DG CHEST PORT 1 VIEW  Result Date: 12/13/2019 CLINICAL DATA:  Pneumonia. EXAM: PORTABLE CHEST 1 VIEW COMPARISON:  12-26-2019 FINDINGS: 0922 hours. Bilateral patchy ill-defined airspace disease noted in the lungs appears mildly progressive in the interval, notably at the left base and right infrahilar region. Cardiopericardial silhouette is upper limits of normal for size. The visualized bony structures of the thorax are intact. Telemetry leads overlie the chest. IMPRESSION: Interval progression of widespread bilateral ill-defined patchy airspace disease consistent with pneumonia. Electronically Signed   By: Kennith Center M.D.   On: 12/13/2019 09:39   DG Chest Port 1 View  Result Date: 2019/12/26 CLINICAL DATA:  74 year old female with shortness of breath. Positive COVID-19. EXAM: PORTABLE CHEST 1 VIEW COMPARISON:  Chest radiographs 10/17/2009 and earlier. FINDINGS: Portable AP upright view at 0616 hours. Lung volumes and mediastinal contours are within normal limits. There is widespread patchy and indistinct peripheral and basilar predominant bilateral pulmonary opacity, typical of COVID-19. Pneumonia. Evidence of thyroidectomy at the thoracic inlet. Visualized tracheal air column is within normal limits. No pneumothorax, pulmonary edema or pleural effusion. No acute osseous abnormality identified. IMPRESSION: 1. Widespread bilateral patchy and indistinct peripheral and basilar predominant opacity typical of COVID-19 pneumonia. 2. Evidence of prior thyroidectomy. Electronically Signed   By: Odessa Fleming M.D.   On: 2019/12/26 06:32    Microbiology Recent Results (from the past  240  hour(s))  Culture, blood (routine x 2)     Status: None (Preliminary result)   Collection Time: 12/23/19  6:34 AM   Specimen: BLOOD RIGHT ARM  Result Value Ref Range Status   Specimen Description BLOOD RIGHT ARM  Final   Special Requests   Final    BOTTLES DRAWN AEROBIC AND ANAEROBIC Blood Culture adequate volume   Culture   Final    NO GROWTH 4 DAYS Performed at [facility], [address], [place], [place] [address]    Report Status PENDING  Incomplete  Culture, blood (routine x 2)     Status: None (Preliminary result)   Collection Time: Dec 23, 2019  6:43 AM   Specimen: BLOOD RIGHT ARM  Result Value Ref Range Status   Specimen Description BLOOD RIGHT ARM  Final   Special Requests   Final    BOTTLES DRAWN AEROBIC AND ANAEROBIC Blood Culture adequate volume   Culture   Final    NO GROWTH 4 DAYS Performed at [facility], [address], [place], [place] [address]    Report Status PENDING  Incomplete    Lab Basic Metabolic Panel: Recent Labs  Lab 23-Dec-2019 0607 12-23-19 0949 12/13/19 0148 12/14/19 0414 11/19/2019 0454  NA 131*  --  137 141 139  K 4.2  --  4.6 3.9 4.3  CL 99  --  95* 102 98  CO2 23  --  28 27 28   GLUCOSE 156*  --  126* 118* 153*  BUN 15  --  18 23 27*  CREATININE 0.75 0.70 0.53 0.63 0.82  CALCIUM 8.0*  --  7.8* 8.6* 8.5*   Liver Function Tests: Recent Labs  Lab December 23, 2019 0607 12/13/19 0148 12/14/19 0414 12/07/2019 0454  AST 37 33 29 48*  ALT 26 22 22 26   ALKPHOS 64 62 69 93  BILITOT 0.4 0.6 0.6 0.8  PROT 6.6 6.7 6.3* 6.5  ALBUMIN 2.8* 2.5* 2.3* 2.5*   No results for input(s): LIPASE, AMYLASE in the last 168 hours. No results for input(s): AMMONIA in the last 168 hours. CBC: Recent Labs  Lab 23-Dec-2019 0607 2019-12-23 0949 12/13/19 0148 12/14/19 0414 12/16/2019 0454  WBC 8.2 8.8 9.2 11.3* 13.7*  NEUTROABS 7.3  --  7.9* 9.6* 12.3*  HGB 11.7* 11.2* 11.6* 11.6* 11.5*  HCT 36.8 35.0* 37.2 36.5 36.3  MCV 95.1 94.9 95.9 95.3 93.3  PLT 236 222 302 332  248   Cardiac Enzymes: No results for input(s): CKTOTAL, CKMB, CKMBINDEX, TROPONINI in the last 168 hours. Sepsis Labs: Recent Labs  Lab 2019/12/23 0643 12-23-2019 0923 2019/12/23 0949 12/13/19 0148 12/14/19 0414 12/14/2019 0454  PROCALCITON  --   --  6.04 5.79 5.38 4.10  WBC  --   --  8.8 9.2 11.3* 13.7*  LATICACIDVEN 1.0 0.8  --   --   --   --     Procedures/Operations  None   Barb Merino 12/08/2019, 4:29 PM

## 2019-12-18 DEATH — deceased

## 2020-11-08 IMAGING — DX DG CHEST 1V PORT
1 series · 1 of 1 positions shown · non-contrast
Comparison: Chest radiograph from one day prior.

CLINICAL DATA: XAH49-FO pneumonia

EXAM:
PORTABLE CHEST 1 VIEW

[chest]
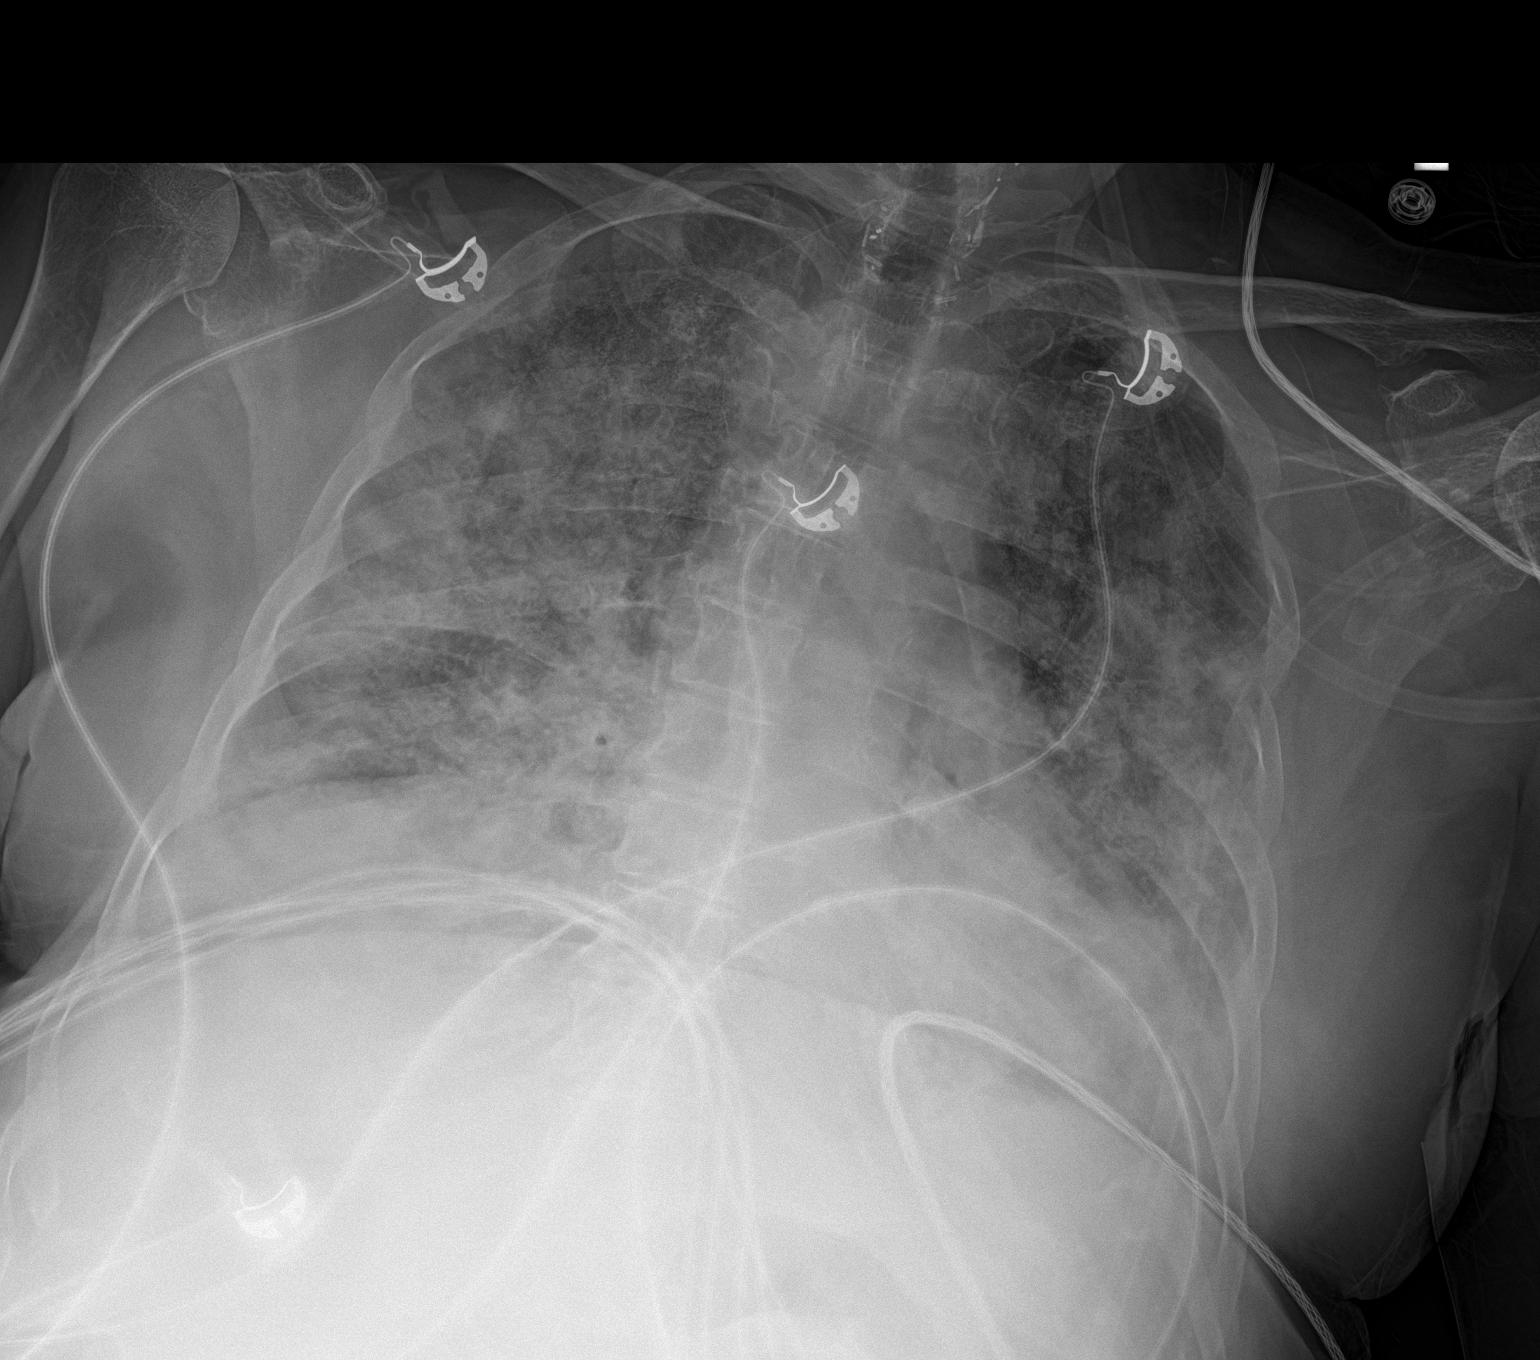

[1 of 1 positions shown; findings below may reference images not displayed]

FINDINGS: Stable cardiomediastinal silhouette with normal heart size. No
pneumothorax. No pleural effusion. Severe patchy opacities
throughout both lungs appears slightly worsened.
IMPRESSION: Severe patchy opacities throughout both lungs, slightly worsened,
compatible with XAH49-FO pneumonia.
